# Patient Record
Sex: Male | Born: 1950 | Race: White | Hispanic: No | Marital: Married | Smoking: Never smoker
Health system: Southern US, Community
[De-identification: ages and names within clinical notes are randomized; demographics above are authoritative.]

## PROBLEM LIST (undated history)

## (undated) DIAGNOSIS — H409 Unspecified glaucoma: Secondary | ICD-10-CM

## (undated) DIAGNOSIS — T8859XA Other complications of anesthesia, initial encounter: Secondary | ICD-10-CM

## (undated) DIAGNOSIS — F329 Major depressive disorder, single episode, unspecified: Secondary | ICD-10-CM

## (undated) DIAGNOSIS — F32A Depression, unspecified: Secondary | ICD-10-CM

## (undated) DIAGNOSIS — E785 Hyperlipidemia, unspecified: Secondary | ICD-10-CM

## (undated) DIAGNOSIS — IMO0002 Reserved for concepts with insufficient information to code with codable children: Secondary | ICD-10-CM

## (undated) DIAGNOSIS — M8580 Other specified disorders of bone density and structure, unspecified site: Secondary | ICD-10-CM

## (undated) DIAGNOSIS — K219 Gastro-esophageal reflux disease without esophagitis: Secondary | ICD-10-CM

## (undated) DIAGNOSIS — K8020 Calculus of gallbladder without cholecystitis without obstruction: Secondary | ICD-10-CM

## (undated) DIAGNOSIS — N390 Urinary tract infection, site not specified: Secondary | ICD-10-CM

## (undated) DIAGNOSIS — R51 Headache: Secondary | ICD-10-CM

## (undated) DIAGNOSIS — A499 Bacterial infection, unspecified: Secondary | ICD-10-CM

## (undated) DIAGNOSIS — T4145XA Adverse effect of unspecified anesthetic, initial encounter: Secondary | ICD-10-CM

## (undated) DIAGNOSIS — I739 Peripheral vascular disease, unspecified: Secondary | ICD-10-CM

## (undated) DIAGNOSIS — I10 Essential (primary) hypertension: Secondary | ICD-10-CM

## (undated) DIAGNOSIS — K900 Celiac disease: Secondary | ICD-10-CM

## (undated) HISTORY — DX: Essential (primary) hypertension: I10

## (undated) HISTORY — DX: Hyperlipidemia, unspecified: E78.5

## (undated) HISTORY — PX: CATARACT EXTRACTION, BILATERAL: SHX1313

## (undated) HISTORY — PX: TONSILLECTOMY: SUR1361

## (undated) HISTORY — DX: Gastro-esophageal reflux disease without esophagitis: K21.9

## (undated) HISTORY — PX: TRANSURETHRAL RESECTION OF PROSTATE: SHX73

## (undated) HISTORY — PX: OTHER SURGICAL HISTORY: SHX169

---

## 2002-12-31 ENCOUNTER — Encounter: Admission: RE | Admit: 2002-12-31 | Discharge: 2002-12-31 | Payer: Self-pay | Admitting: Infectious Diseases

## 2003-01-07 ENCOUNTER — Encounter: Admission: RE | Admit: 2003-01-07 | Discharge: 2003-01-07 | Payer: Self-pay | Admitting: Infectious Diseases

## 2013-03-29 ENCOUNTER — Inpatient Hospital Stay (HOSPITAL_COMMUNITY)
Admission: EM | Admit: 2013-03-29 | Discharge: 2013-03-31 | DRG: 864 | Disposition: A | Discharge status: Home / Self Care | Payer: 59 | Attending: Internal Medicine | Admitting: Internal Medicine

## 2013-03-29 ENCOUNTER — Encounter (HOSPITAL_COMMUNITY): Payer: Self-pay | Admitting: Emergency Medicine

## 2013-03-29 DIAGNOSIS — D696 Thrombocytopenia, unspecified: Secondary | ICD-10-CM | POA: Diagnosis present

## 2013-03-29 DIAGNOSIS — R509 Fever, unspecified: Principal | ICD-10-CM | POA: Diagnosis present

## 2013-03-29 DIAGNOSIS — R7402 Elevation of levels of lactic acid dehydrogenase (LDH): Secondary | ICD-10-CM | POA: Diagnosis present

## 2013-03-29 DIAGNOSIS — D72819 Decreased white blood cell count, unspecified: Secondary | ICD-10-CM | POA: Diagnosis present

## 2013-03-29 DIAGNOSIS — R5381 Other malaise: Secondary | ICD-10-CM

## 2013-03-29 DIAGNOSIS — Z79899 Other long term (current) drug therapy: Secondary | ICD-10-CM

## 2013-03-29 DIAGNOSIS — W57XXXA Bitten or stung by nonvenomous insect and other nonvenomous arthropods, initial encounter: Secondary | ICD-10-CM | POA: Diagnosis present

## 2013-03-29 DIAGNOSIS — R531 Weakness: Secondary | ICD-10-CM

## 2013-03-29 DIAGNOSIS — A90 Dengue fever [classical dengue]: Secondary | ICD-10-CM | POA: Diagnosis present

## 2013-03-29 DIAGNOSIS — R7401 Elevation of levels of liver transaminase levels: Secondary | ICD-10-CM | POA: Diagnosis present

## 2013-03-29 DIAGNOSIS — B54 Unspecified malaria: Secondary | ICD-10-CM | POA: Diagnosis present

## 2013-03-29 DIAGNOSIS — H409 Unspecified glaucoma: Secondary | ICD-10-CM | POA: Diagnosis present

## 2013-03-29 DIAGNOSIS — A938 Other specified arthropod-borne viral fevers: Secondary | ICD-10-CM | POA: Diagnosis present

## 2013-03-29 DIAGNOSIS — Z7982 Long term (current) use of aspirin: Secondary | ICD-10-CM

## 2013-03-29 DIAGNOSIS — T148 Other injury of unspecified body region: Secondary | ICD-10-CM

## 2013-03-29 HISTORY — DX: Bacterial infection, unspecified: N39.0

## 2013-03-29 HISTORY — DX: Bacterial infection, unspecified: A49.9

## 2013-03-29 HISTORY — DX: Celiac disease: K90.0

## 2013-03-29 HISTORY — DX: Calculus of gallbladder without cholecystitis without obstruction: K80.20

## 2013-03-29 LAB — COMPREHENSIVE METABOLIC PANEL
ALT: 109 U/L — ABNORMAL HIGH (ref 0–53)
AST: 133 U/L — ABNORMAL HIGH (ref 0–37)
Albumin: 3.3 g/dL — ABNORMAL LOW (ref 3.5–5.2)
Alkaline Phosphatase: 148 U/L — ABNORMAL HIGH (ref 39–117)
BUN: 10 mg/dL (ref 6–23)
CO2: 30 mEq/L (ref 19–32)
Calcium: 8.7 mg/dL (ref 8.4–10.5)
Chloride: 99 mEq/L (ref 96–112)
Creatinine, Ser: 0.97 mg/dL (ref 0.50–1.35)
GFR calc Af Amer: >90 mL/min (ref >90)
GFR calc non Af Amer: 87 mL/min — ABNORMAL LOW (ref >90)
Glucose, Bld: 111 mg/dL — ABNORMAL HIGH (ref 70–99)
Potassium: 4.8 mEq/L (ref 3.5–5.1)
Sodium: 132 mEq/L — ABNORMAL LOW (ref 135–145)
Total Bilirubin: 0.5 mg/dL (ref 0.3–1.2)
Total Protein: 5.6 g/dL — ABNORMAL LOW (ref 6.0–8.3)

## 2013-03-29 LAB — CBC WITH DIFFERENTIAL/PLATELET
Basophils Absolute: 0.1 10*3/uL (ref 0.0–0.1)
Basophils Relative: 4 % — ABNORMAL HIGH (ref 0–1)
Eosinophils Absolute: 0 10*3/uL (ref 0.0–0.7)
Eosinophils Relative: 0 % (ref 0–5)
HCT: 40.9 % (ref 39.0–52.0)
Hemoglobin: 13.5 g/dL (ref 13.0–17.0)
Lymphocytes Relative: 40 % (ref 12–46)
Lymphs Abs: 0.7 10*3/uL (ref 0.7–4.0)
MCH: 28.9 pg (ref 26.0–34.0)
MCHC: 33 g/dL (ref 30.0–36.0)
MCV: 87.6 fL (ref 78.0–100.0)
Monocytes Absolute: 0.1 10*3/uL (ref 0.1–1.0)
Monocytes Relative: 8 % (ref 3–12)
Neutro Abs: 0.9 10*3/uL — ABNORMAL LOW (ref 1.7–7.7)
Neutrophils Relative %: 48 % (ref 43–77)
Platelets: 73 10*3/uL — ABNORMAL LOW (ref 150–400)
RBC: 4.67 MIL/uL (ref 4.22–5.81)
RDW: 14.5 % (ref 11.5–15.5)
WBC Morphology: INCREASED
WBC: 1.8 10*3/uL — ABNORMAL LOW (ref 4.0–10.5)

## 2013-03-29 LAB — URINALYSIS, ROUTINE W REFLEX MICROSCOPIC
Bilirubin Urine: NEGATIVE
Glucose, UA: NEGATIVE mg/dL
Hgb urine dipstick: NEGATIVE
Ketones, ur: NEGATIVE mg/dL
Leukocytes, UA: NEGATIVE
Nitrite: NEGATIVE
Protein, ur: NEGATIVE mg/dL
Specific Gravity, Urine: 1.013 (ref 1.005–1.030)
Urobilinogen, UA: 0.2 mg/dL (ref 0.0–1.0)
pH: 7 (ref 5.0–8.0)

## 2013-03-29 MED ORDER — HYDROMORPHONE HCL PF 1 MG/ML IJ SOLN: 0.5000 mg | INTRAMUSCULAR | Status: DC | PRN

## 2013-03-29 MED ORDER — ZOLPIDEM TARTRATE 5 MG PO TABS: 5.0000 mg | ORAL_TABLET | Freq: Every evening | ORAL | Status: DC | PRN

## 2013-03-29 MED ORDER — ATOVAQUONE-PROGUANIL HCL 250-100 MG PO TABS
1.0000 | ORAL_TABLET | Freq: Every day | ORAL | Status: DC
  Administered 2013-03-30: 1 via ORAL
  Filled 2013-03-29: qty 1

## 2013-03-29 MED ORDER — AMITRIPTYLINE HCL 50 MG PO TABS: 50.0000 mg | ORAL_TABLET | Freq: Every day | ORAL | Status: DC

## 2013-03-29 MED ORDER — ACETAMINOPHEN 325 MG PO TABS
650.0000 mg | ORAL_TABLET | Freq: Four times a day (QID) | ORAL | Status: DC | PRN
  Administered 2013-03-30 – 2013-03-31 (×2): 650 mg via ORAL
  Filled 2013-03-29 (×3): qty 2

## 2013-03-29 MED ORDER — ONDANSETRON HCL 4 MG/2ML IJ SOLN: 4.0000 mg | Freq: Four times a day (QID) | INTRAMUSCULAR | Status: DC | PRN

## 2013-03-29 MED ORDER — ONDANSETRON HCL 4 MG PO TABS: 4.0000 mg | ORAL_TABLET | Freq: Four times a day (QID) | ORAL | Status: DC | PRN

## 2013-03-29 MED ORDER — ATORVASTATIN CALCIUM 10 MG PO TABS
10.0000 mg | ORAL_TABLET | Freq: Every day | ORAL | Status: DC
  Administered 2013-03-30: 10 mg via ORAL
  Filled 2013-03-29 (×2): qty 1

## 2013-03-29 MED ORDER — SODIUM CHLORIDE 0.9 % IV SOLN
INTRAVENOUS | Status: DC
  Administered 2013-03-29 – 2013-03-31 (×3): via INTRAVENOUS

## 2013-03-29 MED ORDER — TIMOLOL MALEATE 0.5 % OP SOLN
1.0000 [drp] | Freq: Two times a day (BID) | OPHTHALMIC | Status: DC
  Administered 2013-03-29 – 2013-03-31 (×4): 1 [drp] via OPHTHALMIC
  Filled 2013-03-29: qty 5

## 2013-03-29 MED ORDER — DOXYCYCLINE HYCLATE 100 MG IV SOLR
100.0000 mg | Freq: Two times a day (BID) | INTRAVENOUS | Status: DC
  Administered 2013-03-30 – 2013-03-31 (×3): 100 mg via INTRAVENOUS
  Filled 2013-03-29 (×4): qty 100

## 2013-03-29 MED ORDER — ALUM & MAG HYDROXIDE-SIMETH 200-200-20 MG/5ML PO SUSP
30.0000 mL | Freq: Four times a day (QID) | ORAL | Status: DC | PRN
  Filled 2013-03-29: qty 30

## 2013-03-29 MED ORDER — CALCIUM CARBONATE ANTACID 500 MG PO CHEW
2.0000 | CHEWABLE_TABLET | Freq: Every day | ORAL | Status: DC
  Administered 2013-03-30 – 2013-03-31 (×2): 400 mg via ORAL
  Filled 2013-03-29 (×2): qty 2

## 2013-03-29 MED ORDER — OXYCODONE HCL 5 MG PO TABS: 5.0000 mg | ORAL_TABLET | ORAL | Status: DC | PRN

## 2013-03-29 MED ORDER — VITAMIN D3 25 MCG (1000 UNIT) PO TABS
5000.0000 [IU] | ORAL_TABLET | ORAL | Status: DC
  Administered 2013-03-31: 5000 [IU] via ORAL
  Filled 2013-03-29: qty 5

## 2013-03-29 MED ORDER — DOXYCYCLINE HYCLATE 100 MG PO TABS
100.0000 mg | ORAL_TABLET | Freq: Once | ORAL | Status: AC
  Administered 2013-03-29: 100 mg via ORAL
  Filled 2013-03-29: qty 1

## 2013-03-29 MED ORDER — ACETAMINOPHEN 650 MG RE SUPP: 650.0000 mg | Freq: Four times a day (QID) | RECTAL | Status: DC | PRN

## 2013-03-29 NOTE — ED Notes (Signed)
Patient presents to ED with c/o fever, chills, body aches, weakness headache. Patient was living in [place] and returned home 2 weeks ago and was exposed to malaria. PCP started malaria treatment and [facility] spotted fever treatment. Wife is PA and states  Blood was drawn for malaria and cbc shows wbc low.

## 2013-03-29 NOTE — ED Notes (Addendum)
Dr. Lovell Sheehan states pt needs general precaution protect pt due to low WBC and no need for negative pressure room. Receiving RN notified in the unit.

## 2013-03-29 NOTE — ED Provider Notes (Signed)
History     CSN: [id number]  Arrival date & time 03/29/13  7829   First MD Initiated Contact with Patient 03/29/13 1903      Chief Complaint  Patient presents with  . Fever   HPI  History provided by the patient and wife. Patient is a 62 year old male with history of prostatectomy and recurrent UTIs, celiac disease who presents with fever and leukopenia. 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 They returned on the flight to [place] 2 weeks ago. Patient did have a high kink in camping trip where he did have a tick bite him in the [place] area. The family has also driven and travel to [place] and then since return to the [place] area. The patient then began having fevers last week. These would recur over several hours. He was taking antipyretics for his symptoms. His wife who is a PA had rapid testing for malaria which she used twice but these were negative. Patient was seen at the PCP walk-in clinic yesterday and was started on Malarone for possible malaria as well as doxycycline for possible [facility] spotted fever or other tickborne illness. Patient return for a recheck of his lab work yesterday with further drop of his white blood cell count. He has continued to feel fatigue and poor. He denies having any of his typical urinary symptoms from previous UTIs. He has not had any cough or shortness of breath. Denies any rash of the skin.      Past Medical History  Diagnosis Date  . Bacterial UTI   . Gallstones   . Celiac disease     History reviewed. No pertinent past surgical history.  History reviewed. No pertinent family history.  History  Substance Use Topics  . Smoking status: Not on file  . Smokeless tobacco: Not on file  . Alcohol Use: Not on file      Review of Systems  Constitutional: Positive for fever and fatigue.  Respiratory: Negative for cough and shortness of breath.   Gastrointestinal: Negative for nausea,  vomiting and diarrhea.  Skin: Negative for rash.  All other systems reviewed and are negative.    Allergies  Gluten meal  Home Medications   Current Outpatient Rx  Name  Route  Sig  Dispense  Refill  . Acetaminophen 500 MG coapsule   Oral   Take 1,000 mg by mouth every 6 (six) hours as needed for fever.         Marland Kitchen amitriptyline (ELAVIL) 50 MG tablet   Oral   Take 50 mg by mouth at bedtime.         Marland Kitchen aspirin EC 81 MG tablet   Oral   Take 81 mg by mouth daily.         Marland Kitchen atovaquone-proguanil (MALARONE) 250-100 MG TABS   Oral   Take 4 tablets by mouth daily.         . Calcium Carbonate Antacid (TUMS ULTRA 1000 PO)   Oral   Take 1 tablet by mouth daily.         . Cholecalciferol 5000 UNITS TABS   Oral   Take 5,000 Units by mouth 3 (three) times a week.         . doxycycline (VIBRA-TABS) 100 MG tablet   Oral   Take 100 mg by mouth 2 (two) times daily.         Marland Kitchen ibuprofen (ADVIL,MOTRIN) 800 MG tablet   Oral  Take 800 mg by mouth daily as needed (migraine).         . rosuvastatin (CRESTOR) 5 MG tablet   Oral   Take 5 mg by mouth every other day.         . SUMAtriptan (IMITREX) 100 MG tablet   Oral   Take 50 mg by mouth every 2 (two) hours as needed for migraine.         . timolol (BETIMOL) 0.5 % ophthalmic solution      1 drop 2 (two) times daily.           BP 110/64  Pulse 58  Temp(Src) 98.1 F (36.7 C) (Oral)  Resp 20  SpO2 99%  Physical Exam  Nursing note and vitals reviewed. Constitutional: He is oriented to person, place, and time. He appears well-developed and well-nourished. No distress.  HENT:  Head: Normocephalic.  Neck: Normal range of motion. Neck supple.  No meningeal signs  Cardiovascular: Normal rate and regular rhythm.   Pulmonary/Chest: Effort normal and breath sounds normal. No respiratory distress. He has no wheezes. He has no rales.  Abdominal: Soft. There is no tenderness.  Musculoskeletal: Normal range of  motion.  Neurological: He is alert and oriented to person, place, and time.  Skin: Skin is warm. No rash noted.  Psychiatric: He has a normal mood and affect. His behavior is normal.    ED Course  Procedures   Results for orders placed during the hospital encounter of 03/29/13  CBC WITH DIFFERENTIAL      Result Value Range   WBC 1.8 (*) 4.0 - 10.5 K/uL   RBC 4.67  4.22 - 5.81 MIL/uL   Hemoglobin 13.5  13.0 - 17.0 g/dL   HCT 29.5  62.1 - 30.8 %   MCV 87.6  78.0 - 100.0 fL   MCH 28.9  26.0 - 34.0 pg   MCHC 33.0  30.0 - 36.0 g/dL   RDW 65.7  84.6 - 96.2 %   Platelets 73 (*) 150 - 400 K/uL   Neutrophils Relative % 48  43 - 77 %   Lymphocytes Relative 40  12 - 46 %   Monocytes Relative 8  3 - 12 %   Eosinophils Relative 0  0 - 5 %   Basophils Relative 4 (*) 0 - 1 %   Neutro Abs 0.9 (*) 1.7 - 7.7 K/uL   Lymphs Abs 0.7  0.7 - 4.0 K/uL   Monocytes Absolute 0.1  0.1 - 1.0 K/uL   Eosinophils Absolute 0.0  0.0 - 0.7 K/uL   Basophils Absolute 0.1  0.0 - 0.1 K/uL   RBC Morphology TEARDROP CELLS     WBC Morphology INCREASED BANDS (>20% BANDS)     Smear Review LARGE PLATELETS PRESENT    COMPREHENSIVE METABOLIC PANEL      Result Value Range   Sodium 132 (*) 135 - 145 mEq/L   Potassium 4.8  3.5 - 5.1 mEq/L   Chloride 99  96 - 112 mEq/L   CO2 30  19 - 32 mEq/L   Glucose, Bld 111 (*) 70 - 99 mg/dL   BUN 10  6 - 23 mg/dL   Creatinine, Ser 9.52  0.50 - 1.35 mg/dL   Calcium 8.7  8.4 - 84.1 mg/dL   Total Protein 5.6 (*) 6.0 - 8.3 g/dL   Albumin 3.3 (*) 3.5 - 5.2 g/dL   AST 324 (*) 0 - 37 U/L   ALT 109 (*) 0 - 53  U/L   Alkaline Phosphatase 148 (*) 39 - 117 U/L   Total Bilirubin 0.5  0.3 - 1.2 mg/dL   GFR calc non Af Amer 87 (*) >90 mL/min   GFR calc Af Amer >90  >90 mL/min  URINALYSIS, ROUTINE W REFLEX MICROSCOPIC      Result Value Range   Color, Urine YELLOW  YELLOW   APPearance CLEAR  CLEAR   Specific Gravity, Urine 1.013  1.005 - 1.030   pH 7.0  5.0 - 8.0   Glucose, UA NEGATIVE   NEGATIVE mg/dL   Hgb urine dipstick NEGATIVE  NEGATIVE   Bilirubin Urine NEGATIVE  NEGATIVE   Ketones, ur NEGATIVE  NEGATIVE mg/dL   Protein, ur NEGATIVE  NEGATIVE mg/dL   Urobilinogen, UA 0.2  0.0 - 1.0 mg/dL   Nitrite NEGATIVE  NEGATIVE   Leukocytes, UA NEGATIVE  NEGATIVE         1. Fever   2. Leukopenia       MDM  8:05 PM patient seen and evaluated. Patient currently resting comfortably no acute distress. Initially he is afebrile at triage. He has been taking antipyretics.  Today patient was seen by Dr. Zachery Dauer at the [facility] walk-in clinic. He did telephone the on-call infectious disease doctor here at [facility], Dr. Drue Second. I spoke with Dr. Drue Second directly. She has recommended several blood testing for malaria and tick borne illnesses. Also advised to have blood cultures drawn. She believes patient will benefit from observation admission.  She recommends consulting the hospitalist.she will continue to follow the blood smear and lab results for any additional recommendations. She believes he should continue his Malarone and doxycycline   Spoke with Dr. Lovell Sheehan with [facility].  She will see pt and admit. Would like holding orders for med-surg bed under team [id number].   Angus Seller, PA-C 03/29/13 2208

## 2013-03-29 NOTE — H&P (Addendum)
[facility] History and Physical  [name] ZOX:[id number] DOB: June 11, 1951 DOA: 03/29/2013  Referring physician: EDP PCP: Daisy Floro, MD  Specialists:    Chief Complaint: Fevers  HPI: Francisco Rodriguez is a 62 y.o. male who returned from [place] 2 weeks ago presenting to the ED with complaints of cyclic fevers  Occuring every 8 hours for the past 5 days.   He reports headaches, fevers, chills, anorexia, malaise, myalgias and rigors since Tuesday.   His wife who is a PA performed a rapid Malaria Test which was negative.  He had seen a tick on his body 2 weeks ago as well but he denies seeing a rash on his body .   He was taken to the [facility] and evaluated 1 day ago and was found to have leukopenia and thrombocytopenia and was placed on Malarone, and Doxycycline.   He returned to the clinic today and had further decrease in his WBCs and PLTs and was referred to the ED.  The EDP evaluated and contacted ID on Call Dr.Snyder who recommended continuing the Malarone and Doxycycline.   He was referred for medical admission and placed on Neutropenic Precautions.    Review of Systems: The patient denies weight loss, vision loss, decreased hearing, hoarseness, chest pain, syncope, dyspnea on exertion, peripheral edema, balance deficits, hemoptysis, abdominal pain, nausea, vomiting, diarrhea, constipation, hematemesis, melena, hematochezia, severe indigestion/heartburn, hematuria, incontinence, suspicious skin lesions, transient blindness, difficulty walking, depression, unusual weight change, abnormal bleeding, enlarged lymph nodes, angioedema, and breast masses.    Past Medical History  Diagnosis Date  . Bacterial UTI   . Gallstones   . Celiac disease         Hx DVT in RLE 20 years ago (Untreated)       Glaucoma   Past Surgical History  Procedure Laterality Date  . Cataract extraction, bilateral    . 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 Authorizing Provider  Acetaminophen 500 MG coapsule Take 1,000 mg by mouth every 6 (six) hours as needed for fever.   Yes Historical Provider, MD  amitriptyline (ELAVIL) 50 MG tablet Take 50 mg by mouth at bedtime.   Yes Historical Provider, MD  aspirin EC 81 MG tablet Take 81 mg by mouth daily.   Yes Historical Provider, MD  atovaquone-proguanil (MALARONE) 250-100 MG TABS Take 4 tablets by mouth daily.   Yes Historical Provider, MD  Calcium Carbonate Antacid (TUMS ULTRA 1000 PO) Take 1 tablet by mouth daily.   Yes Historical Provider, MD  Cholecalciferol 5000 UNITS TABS Take 5,000 Units by mouth 3 (three) times a week.   Yes Historical Provider, MD  doxycycline (VIBRA-TABS) 100 MG tablet Take 100 mg by mouth 2 (two) times daily.   Yes Historical Provider, MD  ibuprofen (ADVIL,MOTRIN) 800 MG tablet Take 800 mg by mouth daily as needed (migraine).   Yes Historical Provider, MD  rosuvastatin (CRESTOR) 5 MG tablet Take 5 mg by mouth every other day.   Yes Historical Provider, MD  SUMAtriptan (IMITREX) 100 MG tablet Take 50 mg by mouth every 2 (two) hours as needed for migraine.   Yes Historical Provider, MD  timolol (BETIMOL) 0.5 % ophthalmic solution 1 drop 2 (two) times daily.   Yes Historical Provider, MD    Allergies:  Allergies  Allergen  Reactions  . 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    Family History: Family History  Problem Relation Age of Onset  . Hypertension Father      Physical Exam:  GEN:   Pleasant  Well Nourished and well developed Obese 62 year old Caucasian Male  examined  and in no acute distress; cooperative with exam Filed Vitals:   03/29/13 2100 03/29/13 2130 03/29/13 2144 03/29/13 2258  BP: 108/69 107/57  116/67  Pulse: 57 54   58  Temp:    98.5 F (36.9 C)  TempSrc:    Oral  Resp:    18  Height:    5\' 11"  (1.803 m)  Weight:    89.6 kg (197 lb 8.5 oz)  SpO2: 98% 98% 98% 98%   Blood pressure 116/67, pulse 58, temperature 98.5 F (36.9 C), temperature source Oral, resp. rate 18, height 5\' 11"  (1.803 m), weight 89.6 kg (197 lb 8.5 oz), SpO2 98.00%. PSYCH: He is alert and oriented x4; does not appear anxious does not appear depressed; affect is normal HEENT: Normocephalic and Atraumatic, Mucous membranes pink; PERRLA; EOM intact; Fundi:  Benign;  No scleral icterus, Nares: Patent, Oropharynx: Clear, Fair Dentition, Neck:  FROM, no cervical lymphadenopathy nor thyromegaly or carotid bruit; no JVD; Breasts:: Not examined CHEST WALL: No tenderness CHEST: Normal respiration, clear to auscultation bilaterally HEART: Regular rate and rhythm; no murmurs rubs or gallops BACK: No kyphosis or scoliosis; no CVA tenderness ABDOMEN: Positive Bowel Sounds, Obese, soft non-tender; no masses, no organomegaly.    Rectal Exam: Not done EXTREMITIES: No cyanosis, clubbing or edema; no ulcerations. Genitalia: not examined PULSES: 2+ and symmetric SKIN: Normal hydration no rash or ulceration CNS: Cranial nerves 2-12 grossly intact no focal neurologic deficit   Labs & Imaging Results for orders placed during the hospital encounter of 03/29/13 (from the past 48 hour(s))  CBC WITH DIFFERENTIAL     Status: Abnormal   Collection Time    03/29/13  8:25 PM      Result Value Range   WBC 1.8 (*) 4.0 - 10.5 K/uL   RBC 4.67  4.22 - 5.81 MIL/uL   Hemoglobin 13.5  13.0 - 17.0 g/dL   HCT 47.8  29.5 - 62.1 %   MCV 87.6  78.0 - 100.0 fL   MCH 28.9  26.0 - 34.0 pg   MCHC 33.0  30.0 - 36.0 g/dL   RDW 30.8  65.7 - 84.6 %   Platelets 73 (*) 150 - 400 K/uL   Comment: PLATELET COUNT CONFIRMED BY SMEAR     LARGE PLATELETS PRESENT   Neutrophils Relative % 48  43 - 77 %   Lymphocytes Relative 40  12 - 46 %   Monocytes Relative 8  3 - 12 %    Eosinophils Relative 0  0 - 5 %   Basophils Relative 4 (*) 0 - 1 %   Neutro Abs 0.9 (*) 1.7 - 7.7 K/uL   Lymphs Abs 0.7  0.7 - 4.0 K/uL   Monocytes Absolute 0.1  0.1 - 1.0 K/uL   Eosinophils Absolute 0.0  0.0 - 0.7 K/uL   Basophils Absolute 0.1  0.0 - 0.1 K/uL   RBC Morphology TEARDROP CELLS     WBC Morphology INCREASED BANDS (>20% BANDS)     Smear Review LARGE PLATELETS PRESENT  COMPREHENSIVE METABOLIC PANEL     Status: Abnormal   Collection Time    03/29/13  9:04 PM      Result Value Range   Sodium 132 (*) 135 - 145 mEq/L   Potassium 4.8  3.5 - 5.1 mEq/L   Chloride 99  96 - 112 mEq/L   CO2 30  19 - 32 mEq/L   Glucose, Bld 111 (*) 70 - 99 mg/dL   BUN 10  6 - 23 mg/dL   Creatinine, Ser 1.61  0.50 - 1.35 mg/dL   Calcium 8.7  8.4 - 09.6 mg/dL   Total Protein 5.6 (*) 6.0 - 8.3 g/dL   Albumin 3.3 (*) 3.5 - 5.2 g/dL   AST 045 (*) 0 - 37 U/L   ALT 109 (*) 0 - 53 U/L   Alkaline Phosphatase 148 (*) 39 - 117 U/L   Total Bilirubin 0.5  0.3 - 1.2 mg/dL   GFR calc non Af Amer 87 (*) >90 mL/min   GFR calc Af Amer >90  >90 mL/min   Comment:            The eGFR has been calculated     using the CKD EPI equation.     This calculation has not been     validated in all clinical     situations.     eGFR's persistently     <90 mL/min signify     possible Chronic Kidney Disease.  URINALYSIS, ROUTINE W REFLEX MICROSCOPIC     Status: None   Collection Time    03/29/13  9:20 PM      Result Value Range   Color, Urine YELLOW  YELLOW   APPearance CLEAR  CLEAR   Specific Gravity, Urine 1.013  1.005 - 1.030   pH 7.0  5.0 - 8.0   Glucose, UA NEGATIVE  NEGATIVE mg/dL   Hgb urine dipstick NEGATIVE  NEGATIVE   Bilirubin Urine NEGATIVE  NEGATIVE   Ketones, ur NEGATIVE  NEGATIVE mg/dL   Protein, ur NEGATIVE  NEGATIVE mg/dL   Urobilinogen, UA 0.2  0.0 - 1.0 mg/dL   Nitrite NEGATIVE  NEGATIVE   Leukocytes, UA NEGATIVE  NEGATIVE   Comment: MICROSCOPIC NOT DONE ON URINES WITH NEGATIVE PROTEIN,  BLOOD, LEUKOCYTES, NITRITE, OR GLUCOSE <1000 mg/dL.     Assessment/Plan Principal Problem:   Fever Active Problems:   Leukopenia   Thrombocytopenia   Weakness   Tick bite   1.   Fever-  Differential diagnosis includes:  Malaria and possibly Tick Borne Diseases such as RSMF.  Both Covered by IV Doxycycline, and per advice of ID Continue Malarone for the Malarial Diseases as well.  ID (Dr. Ilsa Iha) to see in AM.   Note that patient's wife reports performing a rapid test for Plasmodium Falciparum which returned negative, however not clear if this test also applies to the other 3 strains of malaria ( P.vivax, ovale, or malariae).     2.   Leukopenia and Thrombocytopenia-  Due to Illness #1,  Continue Antibiotics.  Monitor Trends, Neutropenic Precautions.     3.   Weakness- Due to Illness.    4.   Tick Bite Hx-  Check Titers for RMSF, and Lyme Disease.     5.  SCDs for DVT prophylaxis due to Thrombocytopenia   Code Status: FULL CODE    Family Communication:  Wife at Bedside    Disposition Plan:  Return to Home      Time spent: 27 Minutes  Ron Parker [facility] Pager [phone]  If 7PM-7AM, please contact night-coverage [email or web address] Password [facility] 03/29/2013, 11:20 PM

## 2013-03-30 DIAGNOSIS — D709 Neutropenia, unspecified: Secondary | ICD-10-CM

## 2013-03-30 DIAGNOSIS — D696 Thrombocytopenia, unspecified: Secondary | ICD-10-CM

## 2013-03-30 DIAGNOSIS — R5081 Fever presenting with conditions classified elsewhere: Secondary | ICD-10-CM

## 2013-03-30 LAB — PROTIME-INR
INR: 0.98 (ref 0.00–1.49)
Prothrombin Time: 12.9 seconds (ref 11.6–15.2)

## 2013-03-30 LAB — BASIC METABOLIC PANEL
BUN: 9 mg/dL (ref 6–23)
CO2: 30 mEq/L (ref 19–32)
Calcium: 8.1 mg/dL — ABNORMAL LOW (ref 8.4–10.5)
Chloride: 104 mEq/L (ref 96–112)
Creatinine, Ser: 0.93 mg/dL (ref 0.50–1.35)
GFR calc Af Amer: >90 mL/min (ref >90)
GFR calc non Af Amer: 88 mL/min — ABNORMAL LOW (ref >90)
Glucose, Bld: 98 mg/dL (ref 70–99)
Potassium: 4.7 mEq/L (ref 3.5–5.1)
Sodium: 137 mEq/L (ref 135–145)

## 2013-03-30 LAB — DIFFERENTIAL
Basophils Relative: 4 % — ABNORMAL HIGH (ref 0–1)
Eosinophils Relative: 0 % (ref 0–5)
Lymphocytes Relative: 41 % (ref 12–46)
Monocytes Relative: 19 % — ABNORMAL HIGH (ref 3–12)
Neutrophils Relative %: 36 % — ABNORMAL LOW (ref 43–77)
WBC Morphology: INCREASED

## 2013-03-30 LAB — CBC
HCT: 39.9 % (ref 39.0–52.0)
Hemoglobin: 13.2 g/dL (ref 13.0–17.0)
MCH: 29 pg (ref 26.0–34.0)
MCHC: 33.1 g/dL (ref 30.0–36.0)
MCV: 87.7 fL (ref 78.0–100.0)
Platelets: 68 10*3/uL — ABNORMAL LOW (ref 150–400)
RBC: 4.55 MIL/uL (ref 4.22–5.81)
RDW: 14.5 % (ref 11.5–15.5)
WBC: 1.7 10*3/uL — ABNORMAL LOW (ref 4.0–10.5)

## 2013-03-30 LAB — HEPATIC FUNCTION PANEL
ALT: 98 U/L — ABNORMAL HIGH (ref 0–53)
AST: 95 U/L — ABNORMAL HIGH (ref 0–37)
Albumin: 2.9 g/dL — ABNORMAL LOW (ref 3.5–5.2)
Alkaline Phosphatase: 138 U/L — ABNORMAL HIGH (ref 39–117)
Bilirubin, Direct: 0.2 mg/dL (ref 0.0–0.3)
Indirect Bilirubin: 0.2 mg/dL — ABNORMAL LOW (ref 0.3–0.9)
Total Bilirubin: 0.4 mg/dL (ref 0.3–1.2)
Total Protein: 5.4 g/dL — ABNORMAL LOW (ref 6.0–8.3)

## 2013-03-30 LAB — FIBRINOGEN: Fibrinogen: 259 mg/dL (ref 204–475)

## 2013-03-30 MED ORDER — ATOVAQUONE-PROGUANIL HCL 250-100 MG PO TABS
4.0000 | ORAL_TABLET | Freq: Every day | ORAL | Status: DC
  Administered 2013-03-30: 3 via ORAL
  Filled 2013-03-30: qty 4

## 2013-03-30 MED ORDER — ATOVAQUONE-PROGUANIL HCL 250-100 MG PO TABS
4.0000 | ORAL_TABLET | Freq: Every day | ORAL | Status: DC
  Filled 2013-03-30: qty 4

## 2013-03-30 NOTE — Progress Notes (Signed)
[facility] PROGRESS NOTE  Francisco Rodriguez:[id number] DOB: 04-10-1951 DOA: 03/29/2013 PCP: Daisy Floro, MD  HPI/Subjective: Fever is improving, no fever since last night still have some sweating.  Assessment/Plan:  Febrile illness -Patient reports cyclical fever, he was recently back from [place]. -Patient started on Malarone and doxycycline, his fever is improved -Presumed malaria, cannot rule out other viral infections including Dengue fever. -Waiting for peripheral blood smear, ID to evaluate  Leukopenia/thrombocytopenia -This is likely secondary to febrile illness which is likely malaria. -Patient is improving with Malarone and doxycycline. -Watches platelets and WBC closely as they're trending down now. -Leukopenia precautions, avoid fresh fruits and they to tell absolute neutrophil count greater than 1000.  Transaminitis -Without jaundice, no is hemolysis, his hemoglobin is normal. -Also likely secondary to his febrile illness malaria versus other viral infections including Dengue fever. -Malarial infection usually has some degree of hemolysis secondary to intravascular hemolysis/blood sequestration.    Code Status: Full code Family Communication: Plan discussed with the patient and his wife at bedside. Disposition Plan: Remains inpatient   Consultants:  Drue Second of the infectious disease  Procedures:  None  Antibiotics:  Antimalarial, patient is on Malarone and doxycycline  Objective: Filed Vitals:   03/29/13 2258 03/30/13 0001 03/30/13 0402 03/30/13 0835  BP: 116/67 114/54 124/86 131/63  Pulse: 58 58 57 67  Temp: 98.5 F (36.9 C) 98.3 F (36.8 C) 97.6 F (36.4 C) 98.1 F (36.7 C)  TempSrc: Oral Oral Oral Oral  Resp: 18 18 16 18   Height: 5\' 11"  (1.803 m)     Weight: 89.6 kg (197 lb 8.5 oz)     SpO2: 98% 99% 100% 97%    Intake/Output Summary (Last 24 hours) at 03/30/13 1101 Last data filed at 03/30/13 1040  Gross per 24 hour  Intake   837.5 ml  Output   1775 ml  Net -937.5 ml   Filed Weights   03/29/13 2258  Weight: 89.6 kg (197 lb 8.5 oz)    Exam:  General: Alert and awake, oriented x3, not in any acute distress. HEENT: anicteric sclera, pupils reactive to light and accommodation, EOMI CVS: S1-S2 clear, no murmur rubs or gallops Chest: clear to auscultation bilaterally, no wheezing, rales or rhonchi Abdomen: soft nontender, nondistended, normal bowel sounds, no organomegaly Extremities: no cyanosis, clubbing or edema noted bilaterally Neuro: Cranial nerves II-XII intact, no focal neurological deficits  Data Reviewed: Basic Metabolic Panel:  Recent Labs Lab 03/29/13 2104 03/30/13 0520  NA 132* 137  K 4.8 4.7  CL 99 104  CO2 30 30  GLUCOSE 111* 98  BUN 10 9  CREATININE 0.97 0.93  CALCIUM 8.7 8.1*   Liver Function Tests:  Recent Labs Lab 03/29/13 2104  AST 133*  ALT 109*  ALKPHOS 148*  BILITOT 0.5  PROT 5.6*  ALBUMIN 3.3*   No results found for this basename: LIPASE, AMYLASE,  in the last 168 hours No results found for this basename: AMMONIA,  in the last 168 hours CBC:  Recent Labs Lab 03/29/13 2025 03/30/13 0520  WBC 1.8* 1.7*  NEUTROABS 0.9*  --   HGB 13.5 13.2  HCT 40.9 39.9  MCV 87.6 87.7  PLT 73* 68*   Cardiac Enzymes: No results found for this basename: CKTOTAL, CKMB, CKMBINDEX, TROPONINI,  in the last 168 hours BNP (last 3 results) No results found for this basename: PROBNP,  in the last 8760 hours CBG: No results found for this basename: GLUCAP,  in the last 168  hours  No results found for this or any previous visit (from the past 240 hour(s)).   Studies: No results found.  Scheduled Meds: . atorvastatin  10 mg Oral q1800  . atovaquone-proguanil  4 tablet Oral Daily  . calcium carbonate  2 tablet Oral Daily  . [START ON 03/31/2013] cholecalciferol  5,000 Units Oral 3 times weekly  . doxycycline (VIBRAMYCIN) IV  100 mg Intravenous Q12H  . timolol  1 drop Both Eyes  BID   Continuous Infusions: . sodium chloride 125 mL/hr at 03/30/13 0657    Principal Problem:   Fever Active Problems:   Leukopenia   Thrombocytopenia   Weakness   Tick bite    Time spent: 35 minutes    [facility]  [facility] Pager [phone]. If 7PM-7AM, please contact night-coverage at [email or web address], password [facility] 03/30/2013, 11:01 AM  LOS: 1 day

## 2013-03-30 NOTE — ED Provider Notes (Signed)
Medical screening examination/treatment/procedure(s) were performed by non-physician practitioner and as supervising physician I was immediately available for consultation/collaboration.    Fynn Vanblarcom L Takari Duncombe, MD 03/30/13 1525 

## 2013-03-30 NOTE — Consult Note (Addendum)
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 After a hike, he and his wife noticed small ticks on their bodies, not firmly attached to skin. No Rash. However, noticed the development of unilateral HA, associated with photophobia and noise sensitivity on Tuesday which persisted to the following day, which is unusual for his migraine history. He noticed developing rigors and Tmax of 103, which cycled every 8-12 hrs. His wife is a PA did a rapid malaria test kit for p.falicaparum was negative x 2 on thur and Friday. Through out Wed-Fri, he had rigors, fever, chills, sweats, broken with tylenol, malaise and myalgia. He saw Dr. Tenny Craw at [facility] walk-in clinic who checked labs which noted leukopenia, thrombocytopenia. Malaria smears sent to [facility]. He was started on malarone tx doses, and doxycycline on evening of 6/6. He presented to walk-in clinic on 6/7 feeling worse, labs slightly worsened as well, thus sent to ED for evaluation/ 24hr obs. Doxycycline and malarone continued. Peripheral thin smear did not show malaria as prelim report.  In [place], he and his wife do not take malarial proph. Last had malaria 61yrs ago, although they do test when ill. They use bednets and mosquito spray. 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 Last took nitrofurantoin in April 2014.   No  recent diarrheal disease. They do recall going [place]. 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 Urine tested for shisto, but never had serology testing. No recent outbreaks that they know of in [place]. No travel to neighboring countries. He is uptodate on typhoid oral vaccine (2010).  HA is unilateral, denies retroorbital pain, throbbing. Has myalgias but no significant joint pain, no back pain, no quadricept pain, denies n/v/diarrhea, no rash.   Sicne being admitted, he feels better. No fevers, but just had episode of chills.    Past Medical History  Diagnosis Date  . Bacterial UTI   . Gallstones   . Celiac disease   - glaucoma s/p cataract surgery - s/p tonsillectomy - BPH s/p turp - h./o right leg DVT  Allergies:  Allergies  Allergen Reactions  . Gluten Meal      MEDICATIONS: . atorvastatin  10 mg Oral q1800  . atovaquone-proguanil  4 tablet Oral Daily  . calcium carbonate  2 tablet Oral Daily  . [START ON 03/31/2013] cholecalciferol  5,000 Units Oral 3 times weekly  . doxycycline (VIBRAMYCIN) IV  100 mg Intravenous Q12H  . timolol  1 drop Both Eyes BID    History  Substance Use Topics  .  Smoking status: Never Smoker   . Smokeless tobacco: Never Used  . Alcohol Use: No  - he and his wife live in [place], [place]. Last came the [name] last year for their daughter's wedding; here for 3 months for their furlough Q 35yrs.  Family History  Problem Relation Age of Onset  . Hypertension Father      Review of Systems  Constitutional: positive for fever, chills, diaphoresis, activity change, appetite change, fatigue but no unexpected weight change.  HENT: Negative for congestion, sore throat, rhinorrhea, sneezing, trouble swallowing and sinus pressure.  Eyes: Negative for photophobia and visual disturbance.  Respiratory: Negative for  cough, chest tightness, shortness of breath, wheezing and stridor.  Cardiovascular: Negative for chest pain, palpitations and leg swelling.  Gastrointestinal: Negative for nausea, vomiting, abdominal pain, diarrhea, constipation, blood in stool, abdominal distention and anal bleeding.  Genitourinary: Negative for dysuria, hematuria, flank pain and difficulty urinating.  Musculoskeletal: Negative for myalgias, back pain, joint swelling, arthralgias and gait problem.  Skin: Negative for color change, pallor, rash and wound.  Neurological: positive for HA Hematological: Negative for adenopathy. Does not bruise/bleed easily.  Psychiatric/Behavioral: Negative for behavioral problems, confusion, sleep disturbance, dysphoric mood, decreased concentration and agitation.     OBJECTIVE: Temp:  [97.6 F (36.4 C)-98.5 F (36.9 C)] 98.1 F (36.7 C) (06/08 0835) Pulse Rate:  [52-67] 67 (06/08 0835) Resp:  [16-20] 18 (06/08 0835) BP: (102-131)/(54-86) 131/63 mmHg (06/08 0835) SpO2:  [96 %-100 %] 97 % (06/08 0835) FiO2 (%):  [21 %] 21 % (06/07 2144) Weight:  [197 lb 8.5 oz (89.6 kg)] 197 lb 8.5 oz (89.6 kg) (06/07 2258)  Physical Exam  Constitutional: He is oriented to person, place, and time. He appears well-developed and well-nourished. No distress.  HENT:  Mouth/Throat: Oropharynx is clear and moist. No oropharyngeal exudate.  Cardiovascular: Normal rate, regular rhythm and normal heart sounds. Exam reveals no gallop and no friction rub.  No murmur heard.  Pulmonary/Chest: Effort normal and breath sounds normal. No respiratory distress. He has no wheezes.  Abdominal: Soft. Bowel sounds are normal. He exhibits no distension. There is no tenderness.  Lymphadenopathy:  He has no cervical adenopathy.  Neurological: He is alert and oriented to person, place, and time.  Skin: Skin is warm and dry. No rash noted. No erythema.  Psychiatric: He has a normal mood and affect. His behavior is normal.    Ext: right calf slightly trace edema > left leg   LABS: Results for orders placed during the hospital encounter of 03/29/13 (from the past 48 hour(s))  CBC WITH DIFFERENTIAL     Status: Abnormal   Collection Time    03/29/13  8:25 PM      Result Value Range   WBC 1.8 (*) 4.0 - 10.5 K/uL   RBC 4.67  4.22 - 5.81 MIL/uL   Hemoglobin 13.5  13.0 - 17.0 g/dL   HCT 16.1  09.6 - 04.5 %   MCV 87.6  78.0 - 100.0 fL   MCH 28.9  26.0 - 34.0 pg   MCHC 33.0  30.0 - 36.0 g/dL   RDW 40.9  81.1 - 91.4 %   Platelets 73 (*) 150 - 400 K/uL   Comment: PLATELET COUNT CONFIRMED BY SMEAR     LARGE PLATELETS PRESENT   Neutrophils Relative % 48  43 - 77 %   Lymphocytes Relative 40  12 - 46 %   Monocytes Relative 8  3 - 12 %   Eosinophils  Relative 0  0 - 5 %   Basophils Relative 4 (*) 0 - 1 %   Neutro Abs 0.9 (*) 1.7 - 7.7 K/uL   Lymphs Abs 0.7  0.7 - 4.0 K/uL   Monocytes Absolute 0.1  0.1 - 1.0 K/uL   Eosinophils Absolute 0.0  0.0 - 0.7 K/uL   Basophils Absolute 0.1  0.0 - 0.1 K/uL   RBC Morphology TEARDROP CELLS     WBC Morphology INCREASED BANDS (>20% BANDS)     Smear Review LARGE PLATELETS PRESENT    COMPREHENSIVE METABOLIC PANEL     Status: Abnormal   Collection Time    03/29/13  9:04 PM      Result Value Range   Sodium 132 (*) 135 - 145 mEq/L   Potassium 4.8  3.5 - 5.1 mEq/L   Chloride 99  96 - 112 mEq/L   CO2 30  19 - 32 mEq/L   Glucose, Bld 111 (*) 70 - 99 mg/dL   BUN 10  6 - 23 mg/dL   Creatinine, Ser 1.61  0.50 - 1.35 mg/dL   Calcium 8.7  8.4 - 09.6 mg/dL   Total Protein 5.6 (*) 6.0 - 8.3 g/dL   Albumin 3.3 (*) 3.5 - 5.2 g/dL   AST 045 (*) 0 - 37 U/L   ALT 109 (*) 0 - 53 U/L   Alkaline Phosphatase 148 (*) 39 - 117 U/L   Total Bilirubin 0.5  0.3 - 1.2 mg/dL   GFR calc non Af Amer 87 (*) >90 mL/min   GFR calc Af Amer >90  >90 mL/min   Comment:            The eGFR has been calculated     using the CKD EPI equation.     This calculation has not been     validated in all clinical      situations.     eGFR's persistently     <90 mL/min signify     possible Chronic Kidney Disease.  URINALYSIS, ROUTINE W REFLEX MICROSCOPIC     Status: None   Collection Time    03/29/13  9:20 PM      Result Value Range   Color, Urine YELLOW  YELLOW   APPearance CLEAR  CLEAR   Specific Gravity, Urine 1.013  1.005 - 1.030   pH 7.0  5.0 - 8.0   Glucose, UA NEGATIVE  NEGATIVE mg/dL   Hgb urine dipstick NEGATIVE  NEGATIVE   Bilirubin Urine NEGATIVE  NEGATIVE   Ketones, ur NEGATIVE  NEGATIVE mg/dL   Protein, ur NEGATIVE  NEGATIVE mg/dL   Urobilinogen, UA 0.2  0.0 - 1.0 mg/dL   Nitrite NEGATIVE  NEGATIVE   Leukocytes, UA NEGATIVE  NEGATIVE   Comment: MICROSCOPIC NOT DONE ON URINES WITH NEGATIVE PROTEIN, BLOOD, LEUKOCYTES, NITRITE, OR GLUCOSE <1000 mg/dL.  BASIC METABOLIC PANEL     Status: Abnormal   Collection Time    03/30/13  5:20 AM      Result Value Range   Sodium 137  135 - 145 mEq/L   Potassium 4.7  3.5 - 5.1 mEq/L   Chloride 104  96 - 112 mEq/L   CO2 30  19 - 32 mEq/L   Glucose, Bld 98  70 - 99 mg/dL   BUN 9  6 - 23 mg/dL   Creatinine, Ser 4.09  0.50 - 1.35 mg/dL   Calcium 8.1 (*) 8.4 - 10.5 mg/dL   GFR calc non Af Amer 88 (*) >  90 mL/min   GFR calc Af Amer >90  >90 mL/min   Comment:            The eGFR has been calculated     using the CKD EPI equation.     This calculation has not been     validated in all clinical     situations.     eGFR's persistently     <90 mL/min signify     possible Chronic Kidney Disease.  CBC     Status: Abnormal   Collection Time    03/30/13  5:20 AM      Result Value Range   WBC 1.7 (*) 4.0 - 10.5 K/uL   RBC 4.55  4.22 - 5.81 MIL/uL   Hemoglobin 13.2  13.0 - 17.0 g/dL   HCT 16.1  09.6 - 04.5 %   MCV 87.7  78.0 - 100.0 fL   MCH 29.0  26.0 - 34.0 pg   MCHC 33.1  30.0 - 36.0 g/dL   RDW 40.9  81.1 - 91.4 %   Platelets 68 (*) 150 - 400 K/uL   Comment: CONSISTENT WITH PREVIOUS RESULT  DIFFERENTIAL     Status: None   Collection Time     03/30/13  5:20 AM      Result Value Range   Neutrophils Relative % PENDING  43 - 77 %   Neutro Abs PENDING  1.7 - 7.7 K/uL   Band Neutrophils PENDING  0 - 10 %   Lymphocytes Relative PENDING  12 - 46 %   Lymphs Abs PENDING  0.7 - 4.0 K/uL   Monocytes Relative PENDING  3 - 12 %   Monocytes Absolute PENDING  0.1 - 1.0 K/uL   Eosinophils Relative PENDING  0 - 5 %   Eosinophils Absolute PENDING  0.0 - 0.7 K/uL   Basophils Relative PENDING  0 - 1 %   Basophils Absolute PENDING  0.0 - 0.1 K/uL   WBC Morphology PENDING     RBC Morphology PENDING     Smear Review PENDING     nRBC PENDING  0 /100 WBC   Metamyelocytes Relative PENDING     Myelocytes PENDING     Promyelocytes Absolute PENDING     Blasts PENDING    HEPATIC FUNCTION PANEL     Status: Abnormal   Collection Time    03/30/13  5:20 AM      Result Value Range   Total Protein 5.4 (*) 6.0 - 8.3 g/dL   Albumin 2.9 (*) 3.5 - 5.2 g/dL   AST 95 (*) 0 - 37 U/L   ALT 98 (*) 0 - 53 U/L   Alkaline Phosphatase 138 (*) 39 - 117 U/L   Total Bilirubin 0.4  0.3 - 1.2 mg/dL   Bilirubin, Direct 0.2  0.0 - 0.3 mg/dL   Indirect Bilirubin 0.2 (*) 0.3 - 0.9 mg/dL    MICRO: 6/7 blood cx x 2 6/7 blood smear: prelim no malaria  IMAGING: No results found.  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 Started empiric malaria treatment and doxycycline due to leukopenia, thrombocytopenia. Malaria smears pending, although smear is negative from on day 2 of malaria treatment. Mild transaminitis improved but still has leukopenia/neutropenia and thrombocytopenia.  Fevers could be due to malaria, dengue, typhoid fever, chickungunya. Or rickettsial disease due  [place] Once infectious causes are ruled out and if not improvement in symptoms, may need to consider work up  for non-infectious causes  - finish malarone dosing today - continue with doxycycline - will check for dengue serologies, ehrlichiosis serology - will check cbc with diff tomorrow - will check coags, fibrinogen - will check CMV IgM - if has documented fever, please collect 2 sets of blood cultures, repeat blood smear at time of fever  Kaeleb Emond B. Drue Second MD MPH [facility] [phone]

## 2013-03-31 LAB — COMPREHENSIVE METABOLIC PANEL
ALT: 95 U/L — ABNORMAL HIGH (ref 0–53)
AST: 85 U/L — ABNORMAL HIGH (ref 0–37)
Albumin: 2.8 g/dL — ABNORMAL LOW (ref 3.5–5.2)
Alkaline Phosphatase: 142 U/L — ABNORMAL HIGH (ref 39–117)
BUN: 9 mg/dL (ref 6–23)
CO2: 29 mEq/L (ref 19–32)
Calcium: 8.2 mg/dL — ABNORMAL LOW (ref 8.4–10.5)
Chloride: 105 mEq/L (ref 96–112)
Creatinine, Ser: 0.93 mg/dL (ref 0.50–1.35)
GFR calc Af Amer: >90 mL/min (ref >90)
GFR calc non Af Amer: 88 mL/min — ABNORMAL LOW (ref >90)
Glucose, Bld: 97 mg/dL (ref 70–99)
Potassium: 4.4 mEq/L (ref 3.5–5.1)
Sodium: 141 mEq/L (ref 135–145)
Total Bilirubin: 0.3 mg/dL (ref 0.3–1.2)
Total Protein: 5.2 g/dL — ABNORMAL LOW (ref 6.0–8.3)

## 2013-03-31 LAB — CBC WITH DIFFERENTIAL/PLATELET
Basophils Absolute: 0.2 10*3/uL — ABNORMAL HIGH (ref 0.0–0.1)
Basophils Relative: 5 % — ABNORMAL HIGH (ref 0–1)
Eosinophils Absolute: 0 10*3/uL (ref 0.0–0.7)
Eosinophils Relative: 0 % (ref 0–5)
HCT: 37.8 % — ABNORMAL LOW (ref 39.0–52.0)
Hemoglobin: 12.6 g/dL — ABNORMAL LOW (ref 13.0–17.0)
Lymphocytes Relative: 43 % (ref 12–46)
Lymphs Abs: 1.4 10*3/uL (ref 0.7–4.0)
MCH: 29.1 pg (ref 26.0–34.0)
MCHC: 33.3 g/dL (ref 30.0–36.0)
MCV: 87.3 fL (ref 78.0–100.0)
Monocytes Absolute: 0.4 10*3/uL (ref 0.1–1.0)
Monocytes Relative: 12 % (ref 3–12)
Neutro Abs: 1.4 10*3/uL — ABNORMAL LOW (ref 1.7–7.7)
Neutrophils Relative %: 40 % — ABNORMAL LOW (ref 43–77)
Platelets: 94 10*3/uL — ABNORMAL LOW (ref 150–400)
RBC: 4.33 MIL/uL (ref 4.22–5.81)
RDW: 14.5 % (ref 11.5–15.5)
WBC: 3.4 10*3/uL — ABNORMAL LOW (ref 4.0–10.5)

## 2013-03-31 LAB — ROCKY MTN SPOTTED FVR AB, IGG-BLOOD: RMSF IgG: 0.4 IV

## 2013-03-31 LAB — PATHOLOGIST SMEAR REVIEW

## 2013-03-31 LAB — MALARIA SMEAR

## 2013-03-31 LAB — ROCKY MTN SPOTTED FVR AB, IGM-BLOOD: RMSF IgM: 0.56 IV (ref 0.00–0.89)

## 2013-03-31 MED ORDER — DOXYCYCLINE HYCLATE 100 MG PO TABS: 100.0000 mg | ORAL_TABLET | Freq: Two times a day (BID) | ORAL | Status: DC

## 2013-03-31 NOTE — Discharge Summary (Signed)
Physician Discharge Summary  Francisco Rodriguez:[id number] DOB: 1951-09-09 DOA: 03/29/2013  PCP: Daisy Floro, MD  Admit date: 03/29/2013 Discharge date: 03/31/2013  Time spent: 65 minutes  Recommendations for Outpatient Follow-up:  1. Patient is to followup as outpatient with Dr. Drue Second next week. On followup CBC in the compressive metabolic profile need to be obtained to followup on patient's hemoglobin, platelet count, white count, LFTs. Peripheral smear for malaria will need to be followed up upon as outpatient. 2. Patient's family did state that were called by patient's PCP about urine culture and will be prescribed an antibiotic as outpatient. Patient will followup with PCP as needed.  Discharge Diagnoses:  Principal Problem:   Fever Active Problems:   Leukopenia   Thrombocytopenia   Weakness   Tick bite   Discharge Condition: Stable and improved  Diet recommendation: Regular  Filed Weights   03/29/13 2258 03/30/13 2100  Weight: 89.6 kg (197 lb 8.5 oz) 89.5 kg (197 lb 5 oz)    History of present illness:  SHAWNDELL Rodriguez is a 62 y.o. male who returned from [place] 2 weeks ago presenting to the ED with complaints of cyclic fevers Occuring every 8 hours for the past 5 days. He reports headaches, fevers, chills, anorexia, malaise, myalgias and rigors since Tuesday. His wife who is a PA performed a rapid Malaria Test which was negative. He had seen a tick on his body 2 weeks ago as well but he denies seeing a rash on his body . He was taken to the [facility] and evaluated 1 day ago and was found to have leukopenia and thrombocytopenia and was placed on Malarone, and Doxycycline. He returned to the clinic today and had further decrease in his WBCs and PLTs and was referred to the ED. The EDP evaluated and contacted ID on Call Dr.Snyder who recommended continuing the Malarone and Doxycycline. He was referred for medical admission and placed on Neutropenic Precautions.       Hospital Course:  Febrile illness  -Patient reports cyclical fever, he was recently back from [place].  -Patient started on Malarone and doxycycline, his fever is improved  -Presumed malaria, cannot rule out other viral infections including Dengue fever.  -Waiting for peripheral blood smear.  Patient was seen by ID Dr. Drue Second who felt patient's febrile illness could be due to either malaria versus dengue versus tight with fever versus and we are or rickettsial disease due to recent tick exposure in the [place]. Patient was maintained on Malarone and started on doxycycline. CMV IgM was also obtained as well as blood cultures. Patient improved clinically remained afebrile and discharged home on 7 days of doxycycline to complete a ten-day course of antibiotic therapy for presumed tickborne illness. Patient will followup with ID as outpatient. Leukopenia/thrombocytopenia  -This is likely secondary to febrile illness which is likely malaria vs tickborne illness.  -Patient is improving with Malarone and doxycycline.  -Monitored platelets and WBC closely as they're trending down now.  -Patient improved clinically we'll be discharged home in stable and improved condition. On day of discharge patient's white count was up to 3.4 and his platelet count was up to 94. Transaminitis  -Without jaundice, no is hemolysis, his hemoglobin is normal.  -Also likely secondary to his febrile illness malaria versus other viral infections including Dengue fever.  -Malarial infection usually has some degree of hemolysis secondary to intravascular hemolysis/blood sequestration.  Patient's transaminitis trended down and this will need to be followed up upon  as outpatient.     Procedures:  None  Consultations:  Infectious diseases: Dr. Drue Second  Discharge Exam: Filed Vitals:   03/30/13 1509 03/30/13 2100 03/31/13 0539 03/31/13 0900  BP: 113/66 122/61 107/54 109/59  Pulse: 59 63 70 62  Temp: 98.4 F  (36.9 C) 99.4 F (37.4 C) 98.4 F (36.9 C) 97.6 F (36.4 C)  TempSrc: Oral Oral Oral Other (Comment)  Resp: 18 18 18 17   Height:      Weight:  89.5 kg (197 lb 5 oz)    SpO2: 97% 97% 96% 95%    General: NAD Cardiovascular: RRR Respiratory: CTAB  Discharge Instructions  Discharge Orders   Future Appointments Provider Department Dept Phone   04/10/2013 1:30 PM Kandis Cocking, MD [facility], [place] [phone]   Future Orders Complete By Expires     Diet general  As directed     Discharge instructions  As directed     Comments:      Follow up with Dr Drue Second in 1 week. Dr Feliz Beam office will call with appointment time.    Increase activity slowly  As directed         Medication List    STOP taking these medications       MALARONE 250-100 MG Tabs  Generic drug:  atovaquone-proguanil      TAKE these medications       Acetaminophen 500 MG coapsule  Take 1,000 mg by mouth every 6 (six) hours as needed for fever.     amitriptyline 50 MG tablet  Commonly known as:  ELAVIL  Take 50 mg by mouth at bedtime.     aspirin EC 81 MG tablet  Take 81 mg by mouth daily.     Cholecalciferol 5000 UNITS Tabs  Take 5,000 Units by mouth 3 (three) times a week.     doxycycline 100 MG tablet  Commonly known as:  VIBRA-TABS  Take 1 tablet (100 mg total) by mouth 2 (two) times daily. Take for 7 more days then stop.     ibuprofen 800 MG tablet  Commonly known as:  ADVIL,MOTRIN  Take 800 mg by mouth daily as needed (migraine).     rosuvastatin 5 MG tablet  Commonly known as:  CRESTOR  Take 5 mg by mouth every other day.     SUMAtriptan 100 MG tablet  Commonly known as:  IMITREX  Take 50 mg by mouth every 2 (two) hours as needed for migraine.     timolol 0.5 % ophthalmic solution  Commonly known as:  BETIMOL  1 drop 2 (two) times daily.     TUMS ULTRA 1000 PO  Take 1 tablet by mouth daily.       Allergies  Allergen Reactions  . Gluten Meal        Follow-up  Information   Follow up with Judyann Munson, MD On 04/10/2013. (Office will with appointment time.)    Contact information:   [address] [place] [address] [phone]        The results of significant diagnostics from this hospitalization (including imaging, microbiology, ancillary and laboratory) are listed below for reference.    Significant Diagnostic Studies: No results found.  Microbiology: Recent Results (from the past 240 hour(s))  CULTURE, BLOOD (ROUTINE X 2)     Status: None   Collection Time    03/29/13  7:53 PM      Result Value Range Status   Specimen Description BLOOD RIGHT  ARM   Final   Special Requests BOTTLES DRAWN AEROBIC AND ANAEROBIC 10CC EACH   Final   Culture  Setup Time 03/30/2013 05:17   Final   Culture     Final   Value:        BLOOD CULTURE RECEIVED NO GROWTH TO DATE CULTURE WILL BE HELD FOR 5 DAYS BEFORE ISSUING A FINAL NEGATIVE REPORT   Report Status PENDING   Incomplete  MALARIA SMEAR     Status: None   Collection Time    03/29/13  9:04 PM      Result Value Range Status   Specimen Description Blood   Final   Special Requests NONE   Final   Malaria Prep     Final   Value: No Plasmodium or Other Blood Parasites Seen on Thick or Thin Smears For persons strongly suspected of having a blood parasite,but have negative smears, it is recommended that blood films be repeated approximately every 12 to 24 hours for 3 consecutive      days.   Report Status 03/31/2013 FINAL   Final  CULTURE, BLOOD (ROUTINE X 2)     Status: None   Collection Time    03/29/13  9:05 PM      Result Value Range Status   Specimen Description BLOOD RIGHT FOREARM   Final   Special Requests     Final   Value: BOTTLES DRAWN AEROBIC AND ANAEROBIC 10CC AEROBIC 5CC ANAEROBIC   Culture  Setup Time 03/30/2013 05:17   Final   Culture     Final   Value:        BLOOD CULTURE RECEIVED NO GROWTH TO DATE CULTURE WILL BE HELD FOR 5 DAYS BEFORE ISSUING A FINAL NEGATIVE  REPORT   Report Status PENDING   Incomplete     Labs: Basic Metabolic Panel:  Recent Labs Lab 03/29/13 2104 03/30/13 0520 03/31/13 0545  NA 132* 137 141  K 4.8 4.7 4.4  CL 99 104 105  CO2 30 30 29   GLUCOSE 111* 98 97  BUN 10 9 9   CREATININE 0.97 0.93 0.93  CALCIUM 8.7 8.1* 8.2*   Liver Function Tests:  Recent Labs Lab 03/29/13 2104 03/30/13 0520 03/31/13 0545  AST 133* 95* 85*  ALT 109* 98* 95*  ALKPHOS 148* 138* 142*  BILITOT 0.5 0.4 0.3  PROT 5.6* 5.4* 5.2*  ALBUMIN 3.3* 2.9* 2.8*   No results found for this basename: LIPASE, AMYLASE,  in the last 168 hours No results found for this basename: AMMONIA,  in the last 168 hours CBC:  Recent Labs Lab 03/29/13 2025 03/30/13 0520 03/31/13 0545  WBC 1.8* 1.7* 3.4*  NEUTROABS 0.9*  --  1.4*  HGB 13.5 13.2 12.6*  HCT 40.9 39.9 37.8*  MCV 87.6 87.7 87.3  PLT 73* 68* 94*   Cardiac Enzymes: No results found for this basename: CKTOTAL, CKMB, CKMBINDEX, TROPONINI,  in the last 168 hours BNP: BNP (last 3 results) No results found for this basename: PROBNP,  in the last 8760 hours CBG: No results found for this basename: GLUCAP,  in the last 168 hours     Signed:  Zanden Colver  [facility] 03/31/2013, 2:19 PM

## 2013-03-31 NOTE — Progress Notes (Signed)
    [facility]    Date of Admission:  03/29/2013   Total days of antibiotics 4        Day 4 doxy           ID: Francisco Rodriguez is a 62 y.o. male with  Neutropenia/thrombocytopenia with fevers concerning for RMSF, tick borne illness vs. Malaria since he is a returned traveler  Principal Problem:   Fever Active Problems:   Leukopenia   Thrombocytopenia   Weakness   Tick bite    Subjective: Afebrile, feeling much better, looking forward to getting home  Medications:  . atorvastatin  10 mg Oral q1800  . calcium carbonate  2 tablet Oral Daily  . cholecalciferol  5,000 Units Oral 3 times weekly  . doxycycline (VIBRAMYCIN) IV  100 mg Intravenous Q12H  . timolol  1 drop Both Eyes BID    Objective: Vital signs in last 24 hours: Temp:  [97.6 F (36.4 C)-99.4 F (37.4 C)] 97.6 F (36.4 C) (06/09 0900) Pulse Rate:  [59-70] 62 (06/09 0900) Resp:  [17-18] 17 (06/09 0900) BP: (107-122)/(54-66) 109/59 mmHg (06/09 0900) SpO2:  [95 %-97 %] 95 % (06/09 0900) Weight:  [197 lb 5 oz (89.5 kg)] 197 lb 5 oz (89.5 kg) (06/08 2100) Physical Exam  Constitutional: He is oriented to person, place, and time. He appears well-developed and well-nourished. No distress.  HENT:  Mouth/Throat: Oropharynx is clear and moist. No oropharyngeal exudate.  Cardiovascular: Normal rate, regular rhythm and normal heart sounds. Exam reveals no gallop and no friction rub.  No murmur heard.  Pulmonary/Chest: Effort normal and breath sounds normal. No respiratory distress. He has no wheezes.  Lymphadenopathy:  no cervical adenopathy.  Neurological: He is alert and oriented to person, place, and time.  Skin: Skin is warm and dry. No rash noted. No erythema.  Psychiatric: He has a normal mood and affect. His behavior is normal.    Lab Results  Recent Labs  03/30/13 0520 03/31/13 0545  WBC 1.7* 3.4*  HGB 13.2 12.6*  HCT 39.9 37.8*  NA 137 141  K 4.7 4.4  CL 104 105  CO2 30 29    BUN 9 9  CREATININE 0.93 0.93   Liver Panel  Recent Labs  03/29/13 2104 03/30/13 0520 03/31/13 0545  PROT 5.6* 5.4* 5.2*  ALBUMIN 3.3* 2.9* 2.8*  AST 133* 95* 85*  ALT 109* 98* 95*  ALKPHOS 148* 138* 142*  BILITOT 0.5 0.4 0.3  BILIDIR  --  0.2  --   IBILI  --  0.2*  --     Microbiology: 6/7 blood cx NGTD ( had been on doxy x 2 doses)  Studies/Results: No results found.   Assessment/Plan: Febrile illness in returned traveller with leukopenia/neutropenia and thrombocytopenia = appears to be improving by physical exam as well as labs. Can discharge home today to finish out 10 day course of doxycycline for presumed tick borne illness. Will need to follow up on [facility] malaria smear from 6/6.  Will see back in id clinic in 10 days.  Drue Second [facility] Cell: [phone] Pager: [phone]  03/31/2013, 12:36 PM

## 2013-03-31 NOTE — Progress Notes (Signed)
UR COMPLETED  

## 2013-03-31 NOTE — Progress Notes (Signed)
Pt signed discharge papers, IV removed. Room checked for belongings. Escorted off unit via NT.

## 2013-04-02 LAB — CMV IGM: CMV IgM: <8 AU/mL (ref <30.00)

## 2013-04-03 LAB — MISCELLANEOUS TEST

## 2013-04-03 LAB — EHRLICHIA ANTIBODY PANEL

## 2013-04-05 LAB — CULTURE, BLOOD (ROUTINE X 2)
Culture: NO GROWTH
Culture: NO GROWTH

## 2013-04-07 LAB — MISCELLANEOUS TEST

## 2013-04-10 ENCOUNTER — Inpatient Hospital Stay: Payer: Managed Care, Other (non HMO) | Admitting: Internal Medicine

## 2013-04-10 ENCOUNTER — Ambulatory Visit (INDEPENDENT_AMBULATORY_CARE_PROVIDER_SITE_OTHER): Payer: Managed Care, Other (non HMO) | Admitting: Surgery

## 2013-04-10 ENCOUNTER — Ambulatory Visit (INDEPENDENT_AMBULATORY_CARE_PROVIDER_SITE_OTHER): Payer: Managed Care, Other (non HMO) | Admitting: Internal Medicine

## 2013-04-10 ENCOUNTER — Encounter: Payer: Self-pay | Admitting: Internal Medicine

## 2013-04-10 ENCOUNTER — Encounter (INDEPENDENT_AMBULATORY_CARE_PROVIDER_SITE_OTHER): Payer: Self-pay | Admitting: Surgery

## 2013-04-10 VITALS — BP 120/77 | HR 65 | Temp 97.8°F | Wt 196.0 lb

## 2013-04-10 VITALS — BP 132/66 | HR 100 | Resp 14 | Ht 71.0 in | Wt 195.6 lb

## 2013-04-10 DIAGNOSIS — R509 Fever, unspecified: Secondary | ICD-10-CM

## 2013-04-10 DIAGNOSIS — N390 Urinary tract infection, site not specified: Secondary | ICD-10-CM

## 2013-04-10 DIAGNOSIS — K8020 Calculus of gallbladder without cholecystitis without obstruction: Secondary | ICD-10-CM | POA: Insufficient documentation

## 2013-04-10 MED ORDER — FOSFOMYCIN TROMETHAMINE 3 G PO PACK: 3.0000 g | PACK | Freq: Once | ORAL | Status: DC

## 2013-04-10 NOTE — Progress Notes (Addendum)
Re:   Francisco Rodriguez DOB:   10-06-1951 MRN:   [id number]  ASSESSMENT AND PLAN: 1.  Gall bladder disease  1.1 cm gallstone on CT scan from [place]  I discussed with the patient the indications and risks of gall bladder surgery.  The primary risks of gall bladder surgery include, but are not limited to, bleeding, infection, common bile duct injury, and open surgery.   We discussed the typical post-operative recovery course. I tried to answer the patient's questions.  I gave the patient literature about gall bladder surgery.  The patient's recent febrile illness and elevated liver function tests seem unrelated to the gall bladder.  The patient is asymptomatic from this gall stone.    I discussed that in the [place] most of the time we do not operate on asymptomatic gall stones.  Ms. Francisco is in a unique situation in that he works [place] with limited medical facilities and limited resources for emergency care.  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  Though he probably will work for only a few more years and it is unlikely that the gall stone will cause trouble, the limited medical care available to him in [place] makes the risks of watching this much higher than if he lived in the [place]  Therefore, I think he would probably be best served by having an elective cholecystectomy prior to returning to [place].  Mr. Lackey will have to make this decision about surgery and he plans to pass my office note on to the physician at the [facility], Dr. Colin Benton. [Called back to schedule surgery early August}  2.  Febrile illness - etiology unclear  But appears to be getting better.  Further titer results are pending at this time. 3.  Gluten intolerance,   Possible Celiac disease 4.  Thrombocytopenia - secondary to recent febrile illness.  Plts 94,000 - 03/29/2013  I would like to see these return towards normal  before any elective surgery. 5.  Hypercholesterolemia.  On Crestor.  Chief Complaint  Patient presents with  . New Evaluation    eval GB   REFERRING PHYSICIAN: Daisy Floro, MD  HISTORY OF PRESENT ILLNESS: Francisco Rodriguez is a 62 y.o. (DOB: 09-27-1951) white bearded  male whose primary care physician is Daisy Floro, MD and comes to me today for evaluation of gall bladder disese.  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   He got a physical in [place] upon leaving [place] to come home this summer for a sabbatical.  He had some hematuria and underwent a  CT scan in [place] in April 2014.  The CT scan was okay, except they saw a 1.1 cm gall stone seen on CT scan at [facility], [place] - 01/24/2013.  Read by a Dr. Janan Halter.  He brought the disk to our office.  The patient has had no GI symptoms, no fatty food intolerance, no abdominal pain.    Unfortunately, upon arriving back in the [place] He develped a cyclical fever of uncertain etiology.  He also had some exposure to ticks.  He has been treated for possible malaria, though his titers have been okay, and been placed on doxycycline.  He was hospitalized form 03/29/2013 to 03/31/2013 at [facility].  He was seen by Dr. Lowell Guitar (ID).  He was found to have thrombocytopenia, leukopenia, and elevated LFT's (though his bilirubin was normal).  Test for RMSF, Malaria, Dengue fever, Lyme disease, CMV, and Ehrlichia CHAFFEENSIS have been negative so far.  He got more blood work today, which is not back. According to the patient, he is dong much better from this cyclical febrile illness and is getting his strength back. Dr. Colin Benton is the physician at [facility] for the [facility].    Past Medical History  Diagnosis Date  . Bacterial UTI   . Gallstones   . Celiac disease       Past Surgical History  Procedure Laterality Date  .  Cataract extraction, bilateral    . Transurethral resection of prostate        Current Outpatient Prescriptions  Medication Sig Dispense Refill  . Acetaminophen 500 MG coapsule Take 1,000 mg by mouth every 6 (six) hours as needed for fever.      Marland Kitchen amitriptyline (ELAVIL) 50 MG tablet Take 50 mg by mouth at bedtime.      Marland Kitchen aspirin EC 81 MG tablet Take 81 mg by mouth daily.      . Calcium Carbonate Antacid (TUMS ULTRA 1000 PO) Take 1 tablet by mouth daily.      . Cholecalciferol 5000 UNITS TABS Take 5,000 Units by mouth 3 (three) times a week.      Marland Kitchen ibuprofen (ADVIL,MOTRIN) 800 MG tablet Take 800 mg by mouth daily as needed (migraine).      . rosuvastatin (CRESTOR) 5 MG tablet Take 5 mg by mouth every other day.      . SUMAtriptan (IMITREX) 100 MG tablet Take 50 mg by mouth every 2 (two) hours as needed for migraine.      . timolol (BETIMOL) 0.5 % ophthalmic solution 1 drop 2 (two) times daily.      . fosfomycin (MONUROL) 3 G PACK Take 3 g by mouth once.  3 g  2   No current facility-administered medications for this visit.      Allergies  Allergen Reactions  . Gluten Meal     REVIEW OF SYSTEMS: Skin:  No history of rash.  No history of abnormal moles. Infection:  Recent cyclic febrile illness whose etiology is unclear.  But has responded to treatment. Neurologic:  No history of stroke.  No history of seizure.  No history of headaches. Cardiac:  No history of hypertension. No history of heart disease.  No history of seeing a cardiologist. Pulmonary:  Does not smoke cigarettes.  No asthma or bronchitis.  No OSA/CPAP.  Endocrine:  No diabetes. No thyroid disease.  Hypercholesterolemia. Gastrointestinal:  No history of stomach disease.  No history of liver disease.  No history of pancreas disease.  No history of colon disease.  See HPI.  History of negative colonoscopy. Urologic:  History of repeat UTI's.  HIstory of TURP in 2001.  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   History of hematuria.  Etiology unclear. His brother had kidney cancer. Musculoskeletal:  No history of joint or back disease. Hematologic:  Leukopenia and thrombocytopenia from recent illness. Psycho-social:  The patient is oriented.   The patient has no obvious psychologic or social impairment to understanding our conversation and plan.  SOCIAL and FAMILY HISTORY: Married.  His wife is a PA. 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 He is on 3 month sabbatical with plans to return to  [place] at the end of August.  PHYSICAL EXAM: BP 132/66  Pulse 100  Resp 14  Ht 5\' 11"  (1.803 m)  Wt 195 lb 9.6 oz (88.724 kg)  BMI 27.29 kg/m2  General: WN WM who is bearded and who is alert and generally healthy appearing.  HEENT: Normal. Pupils equal. Neck: Supple. No mass.  No thyroid mass. Lymph Nodes:  No supraclavicular or cervical nodes. Lungs: Clear to auscultation and symmetric breath sounds. Heart:  RRR. No murmur or rub. Abdomen: Soft. No mass. No tenderness. No hernia. Normal bowel sounds.  No abdominal scars. Extremities:  Good strength and ROM  in upper and lower extremities. Neurologic:  Grossly intact to motor and sensory function. Psychiatric: Has normal mood and affect. Behavior is normal.   DATA REVIEWED: Data in Epic and notes/disk that the patient brought.  Ovidio Kin, MD,  [facility] [address] [place]  [address]   [place], [place]    [address] Phone:  [phone] FAX:  [phone]

## 2013-04-10 NOTE — Progress Notes (Signed)
[facility] CLINIC NOTE  RFV: hospital follow up for presumed tick borne illness Subjective:    Patient ID: Francisco Rodriguez, male    DOB: [birth year], 62 y.o.   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 After a hike, he and his wife noticed small ticks on their bodies, not firmly attached to skin. No Rash. However, noticed the development of unilateral HA, associated with photophobia and noise sensitivity on Tuesday which persisted to the following day, which is unusual for his migraine history. He noticed developing rigors and Tmax of 103, which cycled every 8-12 hrs. His wife is a PA did a rapid malaria test kit for p.falicaparum was negative x 2 on thur and Friday. Through out Wed-Fri, he had rigors, fever, chills, sweats, broken with tylenol, malaise and myalgia. He saw Dr. Tenny Craw at [facility] walk-in clinic who checked labs which noted leukopenia, thrombocytopenia.  He was started on malarone tx doses, and doxycycline on evening of 6/6. He presented to walk-in clinic on 6/7 feeling worse, labs slightly worsened as well, thus sent to ED for evaluation/ 24hr obs. Doxycycline and malarone continued. He improved during his hospitalization with doxycycline for presumed tick borne illness  He states that he is back to normal health. No fevers, chills, headache or rash.   Current Outpatient Prescriptions on File Prior to Visit  Medication Sig Dispense Refill  . amitriptyline (ELAVIL) 50 MG tablet Take 50 mg by mouth at bedtime.      Marland Kitchen aspirin EC 81 MG tablet Take 81 mg by mouth daily.      . rosuvastatin (CRESTOR) 5 MG tablet Take 5 mg by mouth every other day.      . SUMAtriptan (IMITREX) 100 MG tablet Take 50 mg by mouth every 2 (two) hours as needed for migraine.      . Acetaminophen 500 MG coapsule Take 1,000 mg by  mouth every 6 (six) hours as needed for fever.      . Calcium Carbonate Antacid (TUMS ULTRA 1000 PO) Take 1 tablet by mouth daily.      . Cholecalciferol 5000 UNITS TABS Take 5,000 Units by mouth 3 (three) times a week.      . doxycycline (VIBRA-TABS) 100 MG tablet Take 1 tablet (100 mg total) by mouth 2 (two) times daily. Take for 7 more days then stop.      Marland Kitchen ibuprofen (ADVIL,MOTRIN) 800 MG tablet Take 800 mg by mouth daily as needed (migraine).      . timolol (BETIMOL) 0.5 % ophthalmic solution 1 drop 2 (two) times daily.       No current facility-administered medications on file prior to visit.   History   Social History  . Marital Status: Single    Spouse Name: N/A    Number of Children: N/A  . Years of Education: N/A   Occupational History  . Not on file.   Social History Main Topics  . Smoking status: Never Smoker   . Smokeless tobacco: Never Used  . Alcohol Use: No  . Drug Use: No  . Sexually Active: Yes    Birth Control/ Protection: Other-see comments     Comment: Wife post menopausal   Other Topics Concern  . Not on file   Social History Narrative  . No  narrative on file   family history includes Hypertension in his father.   Review of Systems 12 point ROS is negative    Objective:   Physical Exam BP 120/77  Pulse 65  Temp(Src) 97.8 F (36.6 C) (Oral)  Wt 196 lb (88.905 kg)  BMI 27.35 kg/m2 Physical Exam  Constitutional: He is oriented to person, place, and time. He appears well-developed and well-nourished. No distress.  HENT:  Mouth/Throat: Oropharynx is clear and moist. No oropharyngeal exudate.  Cardiovascular: Normal rate, regular rhythm and normal heart sounds. Exam reveals no gallop and no friction rub.  No murmur heard.  Pulmonary/Chest: Effort normal and breath sounds normal. No respiratory distress. He has no wheezes.  Abdominal: Soft. Bowel sounds are normal. He exhibits no distension. There is no tenderness.  Lymphadenopathy:  He has no  cervical adenopathy.  Neurological: He is alert and oriented to person, place, and time.  Skin: Skin is warm and dry. No rash noted. No erythema.  Psychiatric: He has a normal mood and affect. His behavior is normal.          Assessment & Plan:  Presumed tick borne illness = has finished his doxycycline course. Will check cbc to see that his leukopenia has resolved. We will check RMSF/ehrlichiosis convelescent titer on July 2nd.  Hx of MDR- ecoli UTI = will give them rx for fosfomycin to use in the future when they return back to [place] in September  rtc PRN

## 2013-04-23 ENCOUNTER — Other Ambulatory Visit: Payer: Managed Care, Other (non HMO)

## 2013-04-23 DIAGNOSIS — R509 Fever, unspecified: Secondary | ICD-10-CM

## 2013-04-24 LAB — ROCKY MTN SPOTTED FVR ABS PNL(IGG+IGM)
RMSF IgG: 0.67 IV
RMSF IgM: 0.28 IV

## 2013-04-30 LAB — EHRLICHIA ANTIBODY PANEL
E chaffeensis (HGE) Ab, IgG: 1:64 {titer} — ABNORMAL HIGH
E chaffeensis (HGE) Ab, IgM: <1:20 {titer}

## 2013-05-01 ENCOUNTER — Other Ambulatory Visit (INDEPENDENT_AMBULATORY_CARE_PROVIDER_SITE_OTHER): Payer: Self-pay | Admitting: Surgery

## 2013-05-07 ENCOUNTER — Encounter: Payer: Self-pay | Admitting: Internal Medicine

## 2013-05-13 ENCOUNTER — Encounter (INDEPENDENT_AMBULATORY_CARE_PROVIDER_SITE_OTHER): Payer: Self-pay

## 2013-05-26 ENCOUNTER — Encounter (HOSPITAL_COMMUNITY): Payer: Self-pay | Admitting: Pharmacy Technician

## 2013-05-28 ENCOUNTER — Other Ambulatory Visit (HOSPITAL_COMMUNITY): Payer: Self-pay | Admitting: Surgery

## 2013-05-28 NOTE — Patient Instructions (Addendum)
Francisco Rodriguez  05/28/2013   Your procedure is scheduled on: 06/06/13  FRIDAY   Report to [facility] at  0700     AM.  Call this number if you have problems the morning of surgery: [phone]       Remember:   Do not eat food  Or drink :After Midnight. Thursday NIGHT   Take these medicines the morning of surgery with A SIP OF WATER:No regular meds,   May take Sumatriptan if needed   .  Contacts, dentures or partial plates can not be worn to surgery  Leave suitcase in the car. After surgery it may be brought to your room.  For patients admitted to the hospital, checkout time is 11:00 AM day of  discharge.             SPECIAL INSTRUCTIONS- SEE [place] PREPARING FOR SURGERY INSTRUCTION SHEET-     DO NOT WEAR JEWELRY, LOTIONS, POWDERS, OR PERFUMES.  WOMEN-- DO NOT SHAVE LEGS OR UNDERARMS FOR 12 HOURS BEFORE SHOWERS. MEN MAY SHAVE FACE.  Patients discharged the day of surgery will not be allowed to drive home. IF going home the day of surgery, you must have a driver and someone to stay with you for the first 24 hours  Name and phone number of your driver:   Overnight stay                                                                                                                                                     Seleen Walter  PST [phone]  [phone]                 FAILURE TO FOLLOW THESE INSTRUCTIONS MAY RESULT IN  CANCELLATION   OF YOUR SURGERY                                                  Patient Signature _____________________________

## 2013-05-29 ENCOUNTER — Encounter (HOSPITAL_COMMUNITY): Payer: Self-pay

## 2013-05-29 ENCOUNTER — Encounter (HOSPITAL_COMMUNITY)
Admission: RE | Admit: 2013-05-29 | Discharge: 2013-05-29 | Disposition: A | Discharge status: Home / Self Care | Payer: Managed Care, Other (non HMO) | Source: Ambulatory Visit | Attending: Surgery | Admitting: Surgery

## 2013-05-29 ENCOUNTER — Ambulatory Visit (HOSPITAL_COMMUNITY)
Admission: RE | Admit: 2013-05-29 | Discharge: 2013-05-29 | Disposition: A | Discharge status: Home / Self Care | Payer: Managed Care, Other (non HMO) | Source: Ambulatory Visit | Attending: Surgery | Admitting: Surgery

## 2013-05-29 DIAGNOSIS — M4120 Other idiopathic scoliosis, site unspecified: Secondary | ICD-10-CM | POA: Insufficient documentation

## 2013-05-29 DIAGNOSIS — I10 Essential (primary) hypertension: Secondary | ICD-10-CM | POA: Insufficient documentation

## 2013-05-29 DIAGNOSIS — Z01812 Encounter for preprocedural laboratory examination: Secondary | ICD-10-CM | POA: Insufficient documentation

## 2013-05-29 DIAGNOSIS — Z01818 Encounter for other preprocedural examination: Secondary | ICD-10-CM | POA: Insufficient documentation

## 2013-05-29 DIAGNOSIS — Z01810 Encounter for preprocedural cardiovascular examination: Secondary | ICD-10-CM | POA: Insufficient documentation

## 2013-05-29 DIAGNOSIS — I498 Other specified cardiac arrhythmias: Secondary | ICD-10-CM | POA: Insufficient documentation

## 2013-05-29 HISTORY — DX: Headache: R51

## 2013-05-29 HISTORY — DX: Peripheral vascular disease, unspecified: I73.9

## 2013-05-29 HISTORY — DX: Other specified disorders of bone density and structure, unspecified site: M85.80

## 2013-05-29 HISTORY — DX: Other complications of anesthesia, initial encounter: T88.59XA

## 2013-05-29 HISTORY — DX: Major depressive disorder, single episode, unspecified: F32.9

## 2013-05-29 HISTORY — DX: Depression, unspecified: F32.A

## 2013-05-29 HISTORY — DX: Unspecified glaucoma: H40.9

## 2013-05-29 HISTORY — DX: Adverse effect of unspecified anesthetic, initial encounter: T41.45XA

## 2013-05-29 HISTORY — DX: Reserved for concepts with insufficient information to code with codable children: IMO0002

## 2013-05-29 LAB — COMPREHENSIVE METABOLIC PANEL
ALT: 19 U/L (ref 0–53)
AST: 16 U/L (ref 0–37)
Albumin: 3.8 g/dL (ref 3.5–5.2)
Alkaline Phosphatase: 78 U/L (ref 39–117)
BUN: 12 mg/dL (ref 6–23)
CO2: 34 mEq/L — ABNORMAL HIGH (ref 19–32)
Calcium: 9.5 mg/dL (ref 8.4–10.5)
Chloride: 103 mEq/L (ref 96–112)
Creatinine, Ser: 0.93 mg/dL (ref 0.50–1.35)
GFR calc Af Amer: >90 mL/min (ref >90)
GFR calc non Af Amer: 88 mL/min — ABNORMAL LOW (ref >90)
Glucose, Bld: 67 mg/dL — ABNORMAL LOW (ref 70–99)
Potassium: 4.5 mEq/L (ref 3.5–5.1)
Sodium: 141 mEq/L (ref 135–145)
Total Bilirubin: 0.3 mg/dL (ref 0.3–1.2)
Total Protein: 6.6 g/dL (ref 6.0–8.3)

## 2013-05-29 LAB — CBC WITH DIFFERENTIAL/PLATELET
Basophils Absolute: 0 10*3/uL (ref 0.0–0.1)
Basophils Relative: 1 % (ref 0–1)
Eosinophils Absolute: 0 10*3/uL (ref 0.0–0.7)
Eosinophils Relative: 1 % (ref 0–5)
HCT: 46.7 % (ref 39.0–52.0)
Hemoglobin: 15.1 g/dL (ref 13.0–17.0)
Lymphocytes Relative: 32 % (ref 12–46)
Lymphs Abs: 1.9 10*3/uL (ref 0.7–4.0)
MCH: 29.3 pg (ref 26.0–34.0)
MCHC: 32.3 g/dL (ref 30.0–36.0)
MCV: 90.5 fL (ref 78.0–100.0)
Monocytes Absolute: 0.4 10*3/uL (ref 0.1–1.0)
Monocytes Relative: 7 % (ref 3–12)
Neutro Abs: 3.6 10*3/uL (ref 1.7–7.7)
Neutrophils Relative %: 61 % (ref 43–77)
Platelets: 238 10*3/uL (ref 150–400)
RBC: 5.16 MIL/uL (ref 4.22–5.81)
RDW: 13.7 % (ref 11.5–15.5)
WBC: 5.9 10*3/uL (ref 4.0–10.5)

## 2013-05-29 NOTE — Progress Notes (Signed)
Spoke with patient and wife who stated he had lunch, had eaten breakfast and was "OK"- asymptomatic with blood sugar from this AM

## 2013-06-06 ENCOUNTER — Encounter (HOSPITAL_COMMUNITY): Payer: Self-pay | Admitting: Anesthesiology

## 2013-06-06 ENCOUNTER — Telehealth (INDEPENDENT_AMBULATORY_CARE_PROVIDER_SITE_OTHER): Payer: Self-pay

## 2013-06-06 ENCOUNTER — Ambulatory Visit (HOSPITAL_COMMUNITY): Payer: 59

## 2013-06-06 ENCOUNTER — Ambulatory Visit (HOSPITAL_COMMUNITY)
Admission: RE | Admit: 2013-06-06 | Discharge: 2013-06-06 | Disposition: A | Discharge status: Home / Self Care | Payer: 59 | Source: Ambulatory Visit | Attending: Surgery | Admitting: Surgery

## 2013-06-06 ENCOUNTER — Ambulatory Visit (HOSPITAL_COMMUNITY): Payer: 59 | Admitting: Anesthesiology

## 2013-06-06 ENCOUNTER — Encounter (HOSPITAL_COMMUNITY)
Admission: RE | Disposition: A | Discharge status: Home / Self Care | Payer: Self-pay | Source: Ambulatory Visit | Attending: Surgery

## 2013-06-06 ENCOUNTER — Encounter (HOSPITAL_COMMUNITY): Payer: Self-pay | Admitting: *Deleted

## 2013-06-06 DIAGNOSIS — Z79899 Other long term (current) drug therapy: Secondary | ICD-10-CM | POA: Insufficient documentation

## 2013-06-06 DIAGNOSIS — Z9079 Acquired absence of other genital organ(s): Secondary | ICD-10-CM | POA: Insufficient documentation

## 2013-06-06 DIAGNOSIS — R7989 Other specified abnormal findings of blood chemistry: Secondary | ICD-10-CM | POA: Insufficient documentation

## 2013-06-06 DIAGNOSIS — K8010 Calculus of gallbladder with chronic cholecystitis without obstruction: Secondary | ICD-10-CM

## 2013-06-06 DIAGNOSIS — E7800 Pure hypercholesterolemia, unspecified: Secondary | ICD-10-CM | POA: Insufficient documentation

## 2013-06-06 DIAGNOSIS — K8020 Calculus of gallbladder without cholecystitis without obstruction: Secondary | ICD-10-CM

## 2013-06-06 DIAGNOSIS — Z9849 Cataract extraction status, unspecified eye: Secondary | ICD-10-CM | POA: Insufficient documentation

## 2013-06-06 DIAGNOSIS — D72819 Decreased white blood cell count, unspecified: Secondary | ICD-10-CM | POA: Insufficient documentation

## 2013-06-06 DIAGNOSIS — K900 Celiac disease: Secondary | ICD-10-CM | POA: Insufficient documentation

## 2013-06-06 DIAGNOSIS — D6959 Other secondary thrombocytopenia: Secondary | ICD-10-CM | POA: Insufficient documentation

## 2013-06-06 HISTORY — PX: CHOLECYSTECTOMY: SHX55

## 2013-06-06 IMAGING — RF DG CHOLANGIOGRAM OPERATIVE
1 series · 8 of 8 positions shown · non-contrast
Comparison: None.

CLINICAL DATA: Intraoperative cholangiogram

INTRAOPERATIVE CHOLANGIOGRAM
TECHNIQUE: Multiple fluoroscopic spot radiographs were obtained
during intraoperative cholangiogram and are submitted for
interpretation post-operatively.

[Series 1: run · 2 acquisitions, 8 frames shown]
[im 1/2]
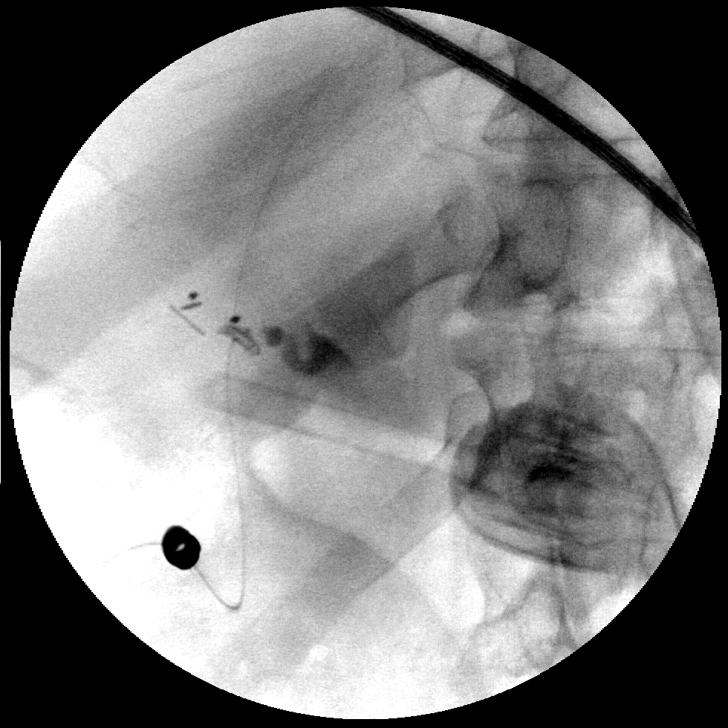
[im 1/2]
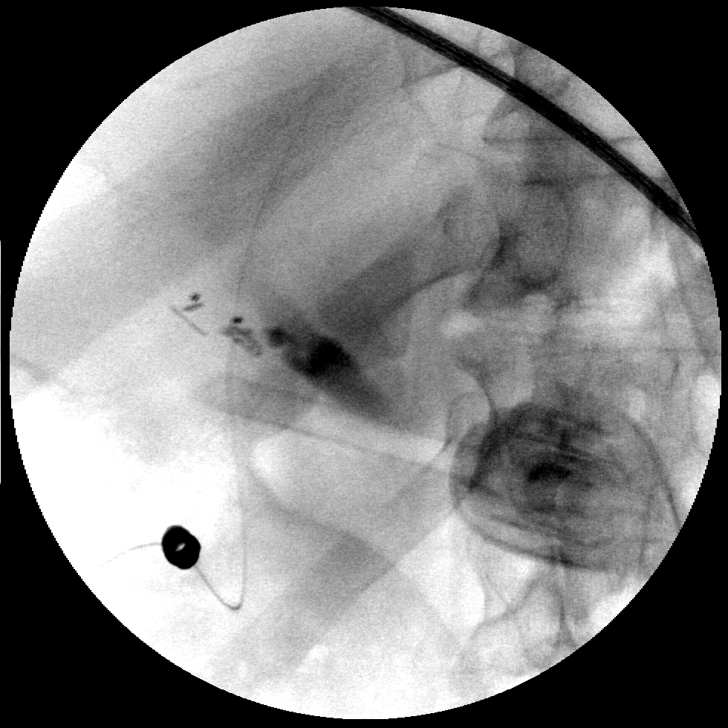
[im 1/2]
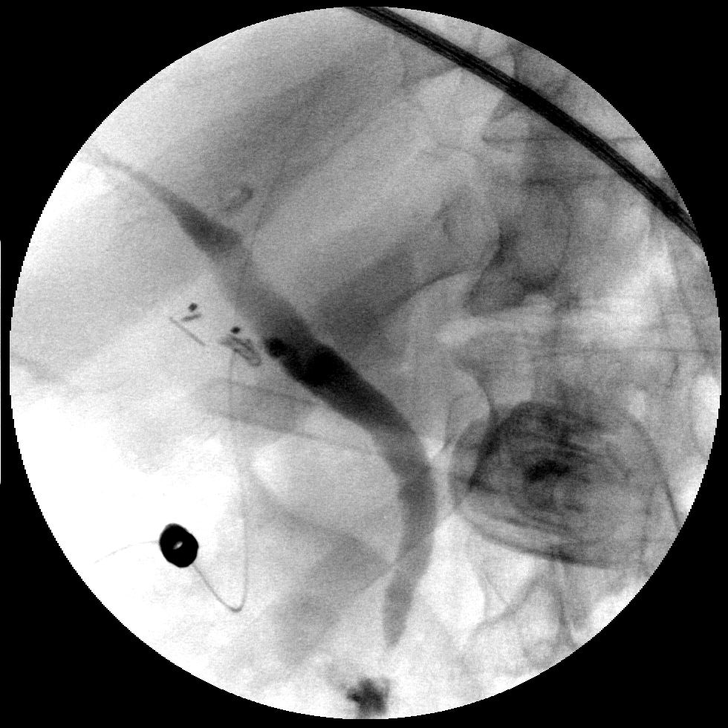
[im 1/2]
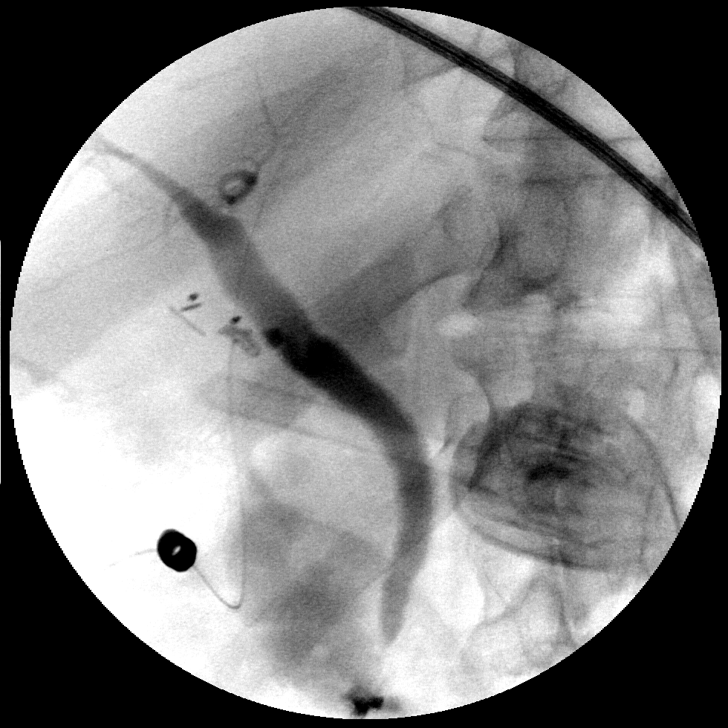
[im 2/2]
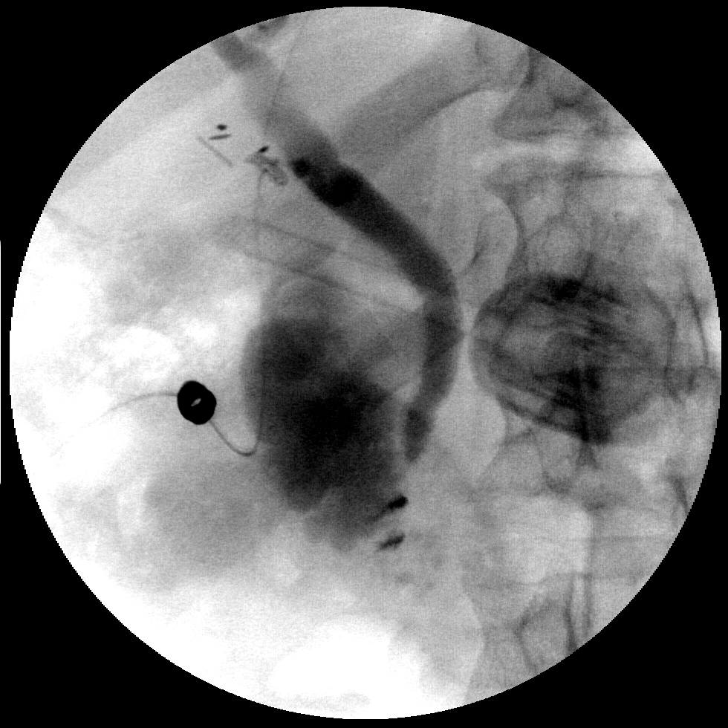
[im 2/2]
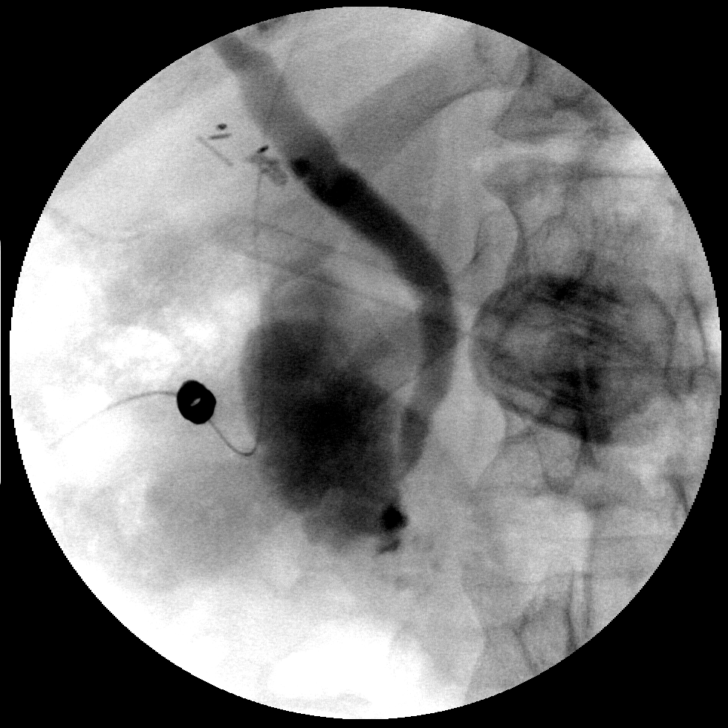
[im 2/2]
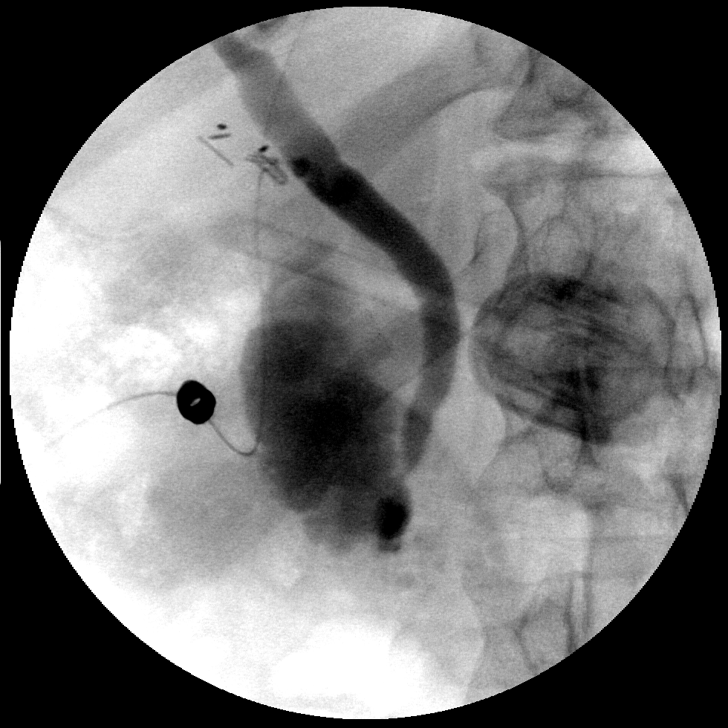
[im 2/2]
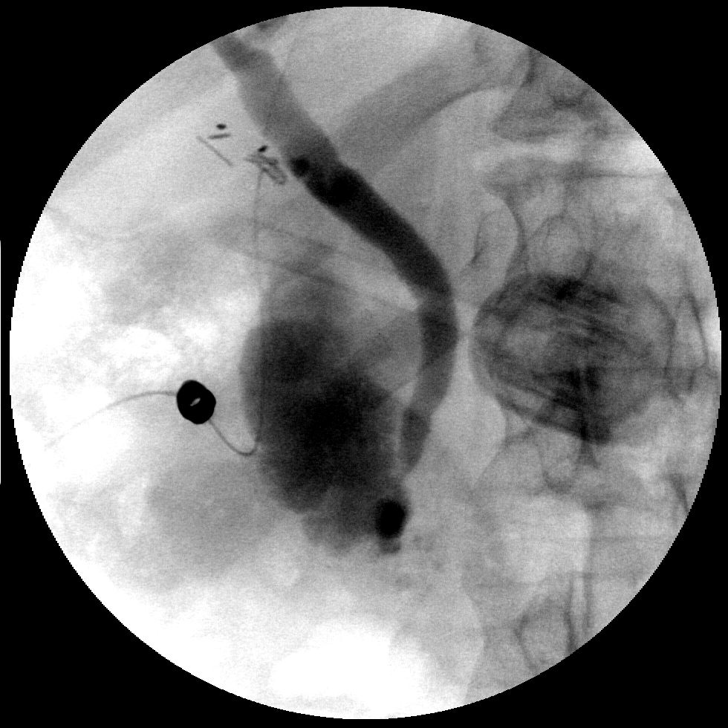

[8 of 8 positions shown; findings below may reference images not displayed]

FINDINGS: The common bile duct and intrahepatic ducts are normal in
caliber and smooth.  There are no filling defects.  Contrast flows
freely into the duodenum.  There is no contrast extravasation.
IMPRESSION: No evidence of a duct stones.  Normal intraoperative cholangiogram.

## 2013-06-06 SURGERY — LAPAROSCOPIC CHOLECYSTECTOMY WITH INTRAOPERATIVE CHOLANGIOGRAM
Anesthesia: General | Site: Abdomen | Wound class: Clean Contaminated

## 2013-06-06 MED ORDER — OXYCODONE HCL 5 MG PO TABS
5.0000 mg | ORAL_TABLET | Freq: Once | ORAL | Status: AC | PRN
  Administered 2013-06-06: 5 mg via ORAL
  Filled 2013-06-06: qty 1

## 2013-06-06 MED ORDER — LACTATED RINGERS IR SOLN
Status: DC | PRN
  Administered 2013-06-06: 1000 mL

## 2013-06-06 MED ORDER — ONDANSETRON HCL 4 MG/2ML IJ SOLN
INTRAMUSCULAR | Status: DC | PRN
  Administered 2013-06-06: 4 mg via INTRAVENOUS

## 2013-06-06 MED ORDER — LIDOCAINE HCL (CARDIAC) 20 MG/ML IV SOLN
INTRAVENOUS | Status: DC | PRN
  Administered 2013-06-06: 50 mg via INTRAVENOUS

## 2013-06-06 MED ORDER — PROPOFOL 10 MG/ML IV BOLUS
INTRAVENOUS | Status: DC | PRN
  Administered 2013-06-06: 150 mg via INTRAVENOUS

## 2013-06-06 MED ORDER — LACTATED RINGERS IV SOLN
INTRAVENOUS | Status: DC
  Administered 2013-06-06: 10:00:00 via INTRAVENOUS
  Administered 2013-06-06: 1000 mL via INTRAVENOUS
  Administered 2013-06-06: 08:00:00 via INTRAVENOUS

## 2013-06-06 MED ORDER — ACETAMINOPHEN 10 MG/ML IV SOLN
1000.0000 mg | Freq: Once | INTRAVENOUS | Status: AC
  Administered 2013-06-06: 1000 mg via INTRAVENOUS
  Filled 2013-06-06: qty 100

## 2013-06-06 MED ORDER — EPHEDRINE SULFATE 50 MG/ML IJ SOLN
INTRAMUSCULAR | Status: DC | PRN
  Administered 2013-06-06 (×2): 10 mg via INTRAVENOUS

## 2013-06-06 MED ORDER — PROMETHAZINE HCL 25 MG/ML IJ SOLN: 6.2500 mg | INTRAMUSCULAR | Status: DC | PRN

## 2013-06-06 MED ORDER — MIDAZOLAM HCL 5 MG/5ML IJ SOLN
INTRAMUSCULAR | Status: DC | PRN
  Administered 2013-06-06: 2 mg via INTRAVENOUS

## 2013-06-06 MED ORDER — GLYCOPYRROLATE 0.2 MG/ML IJ SOLN
INTRAMUSCULAR | Status: DC | PRN
  Administered 2013-06-06: 0.2 mg via INTRAVENOUS
  Administered 2013-06-06: 0.6 mg via INTRAVENOUS

## 2013-06-06 MED ORDER — HYDROMORPHONE HCL PF 1 MG/ML IJ SOLN: 0.2500 mg | INTRAMUSCULAR | Status: DC | PRN

## 2013-06-06 MED ORDER — IOHEXOL 300 MG/ML  SOLN
INTRAMUSCULAR | Status: DC | PRN
  Administered 2013-06-06: 10 mL

## 2013-06-06 MED ORDER — SUCCINYLCHOLINE CHLORIDE 20 MG/ML IJ SOLN
INTRAMUSCULAR | Status: DC | PRN
  Administered 2013-06-06: 100 mg via INTRAVENOUS

## 2013-06-06 MED ORDER — NEOSTIGMINE METHYLSULFATE 1 MG/ML IJ SOLN
INTRAMUSCULAR | Status: DC | PRN
  Administered 2013-06-06: 4 mg via INTRAVENOUS

## 2013-06-06 MED ORDER — ROCURONIUM BROMIDE 100 MG/10ML IV SOLN
INTRAVENOUS | Status: DC | PRN
  Administered 2013-06-06: 35 mg via INTRAVENOUS

## 2013-06-06 MED ORDER — MEPERIDINE HCL 50 MG/ML IJ SOLN: 6.2500 mg | INTRAMUSCULAR | Status: DC | PRN

## 2013-06-06 MED ORDER — ATROPINE SULFATE 1 MG/ML IJ SOLN
INTRAMUSCULAR | Status: DC | PRN
  Administered 2013-06-06: 0.2 mg via INTRAVENOUS

## 2013-06-06 MED ORDER — HYDROCODONE-ACETAMINOPHEN 5-325 MG PO TABS: 1.0000 | ORAL_TABLET | Freq: Four times a day (QID) | ORAL | Status: DC | PRN

## 2013-06-06 MED ORDER — BUPIVACAINE HCL (PF) 0.25 % IJ SOLN
INTRAMUSCULAR | Status: DC | PRN
  Administered 2013-06-06: 30 mL

## 2013-06-06 MED ORDER — OXYCODONE HCL 5 MG/5ML PO SOLN
5.0000 mg | Freq: Once | ORAL | Status: AC | PRN
  Filled 2013-06-06: qty 5

## 2013-06-06 MED ORDER — CEFAZOLIN SODIUM-DEXTROSE 2-3 GM-% IV SOLR
2.0000 g | INTRAVENOUS | Status: AC
  Administered 2013-06-06: 2 g via INTRAVENOUS

## 2013-06-06 MED ORDER — FENTANYL CITRATE 0.05 MG/ML IJ SOLN
INTRAMUSCULAR | Status: DC | PRN
  Administered 2013-06-06: 50 ug via INTRAVENOUS
  Administered 2013-06-06: 100 ug via INTRAVENOUS

## 2013-06-06 SURGICAL SUPPLY — 40 items
APPLIER CLIP ROT 10 11.4 M/L (STAPLE) ×2
BENZOIN TINCTURE PRP APPL 2/3 (GAUZE/BANDAGES/DRESSINGS) ×2 IMPLANT
CANISTER SUCTION 2500CC (MISCELLANEOUS) ×2 IMPLANT
CHLORAPREP W/TINT 26ML (MISCELLANEOUS) ×2 IMPLANT
CHOLANGIOGRAM CATH TAUT (CATHETERS) ×2 IMPLANT
CLIP APPLIE ROT 10 11.4 M/L (STAPLE) ×1 IMPLANT
CLOTH BEACON ORANGE TIMEOUT ST (SAFETY) ×2 IMPLANT
COVER MAYO STAND STRL (DRAPES) IMPLANT
DECANTER SPIKE VIAL GLASS SM (MISCELLANEOUS) ×2 IMPLANT
DERMABOND ADVANCED (GAUZE/BANDAGES/DRESSINGS) ×1
DERMABOND ADVANCED .7 DNX12 (GAUZE/BANDAGES/DRESSINGS) ×1 IMPLANT
DRAPE C-ARM 42X120 X-RAY (DRAPES) IMPLANT
DRAPE LAPAROSCOPIC ABDOMINAL (DRAPES) ×2 IMPLANT
ELECT REM PT RETURN 9FT ADLT (ELECTROSURGICAL) ×2
ELECTRODE REM PT RTRN 9FT ADLT (ELECTROSURGICAL) ×1 IMPLANT
GLOVE BIOGEL PI IND STRL 7.0 (GLOVE) ×1 IMPLANT
GLOVE BIOGEL PI INDICATOR 7.0 (GLOVE) ×1
GLOVE SURG SIGNA 7.5 PF LTX (GLOVE) ×2 IMPLANT
GOWN STRL NON-REIN LRG LVL3 (GOWN DISPOSABLE) ×2 IMPLANT
GOWN STRL REIN XL XLG (GOWN DISPOSABLE) ×4 IMPLANT
HEMOSTAT SURGICEL 4X8 (HEMOSTASIS) IMPLANT
IV CATH 14GX2 1/4 (CATHETERS) ×2 IMPLANT
IV SET EXT 30 76VOL 4 MALE LL (IV SETS) ×2 IMPLANT
KIT BASIN OR (CUSTOM PROCEDURE TRAY) ×2 IMPLANT
NS IRRIG 1000ML POUR BTL (IV SOLUTION) IMPLANT
POUCH SPECIMEN RETRIEVAL 10MM (ENDOMECHANICALS) IMPLANT
SET IRRIG TUBING LAPAROSCOPIC (IRRIGATION / IRRIGATOR) ×2 IMPLANT
SLEEVE ENDOPATH XCEL 5M (ENDOMECHANICALS) ×6 IMPLANT
SOLUTION ANTI FOG 6CC (MISCELLANEOUS) ×2 IMPLANT
STOPCOCK K 69 2C6206 (IV SETS) ×2 IMPLANT
STRIP CLOSURE SKIN 1/4X4 (GAUZE/BANDAGES/DRESSINGS) ×2 IMPLANT
SUT VIC AB 5-0 PS2 18 (SUTURE) ×2 IMPLANT
TOWEL OR 17X26 10 PK STRL BLUE (TOWEL DISPOSABLE) ×6 IMPLANT
TRAY LAP CHOLE (CUSTOM PROCEDURE TRAY) ×2 IMPLANT
TROCAR BLADELESS OPT 5 100 (ENDOMECHANICALS) ×6 IMPLANT
TROCAR XCEL BLUNT TIP 100MML (ENDOMECHANICALS) ×2 IMPLANT
TROCAR XCEL NON-BLD 11X100MML (ENDOMECHANICALS) ×2 IMPLANT
TROCAR XCEL UNIV SLVE 11M 100M (ENDOMECHANICALS) IMPLANT
TUBING INSUFFLATION 10FT LAP (TUBING) ×2 IMPLANT
WATER STERILE IRR 1500ML POUR (IV SOLUTION) ×2 IMPLANT

## 2013-06-06 NOTE — H&P (Signed)
Re: Francisco Rodriguez  DOB: 12/15/1950  MRN: [id number]   ASSESSMENT AND PLAN:  1. Gall bladder disease   1.1 cm gallstone on CT scan from [place]   I discussed with the patient the indications and risks of gall bladder surgery. The primary risks of gall bladder surgery include, but are not limited to, bleeding, infection, common bile duct injury, and open surgery. We discussed the typical post-operative recovery course. I tried to answer the patient's questions.   I gave the patient literature about gall bladder surgery.  The patient's recent febrile illness and elevated liver function tests seem unrelated to the gall bladder.  The patient is asymptomatic from this gall stone.   I discussed that in the [place] most of the time we do not operate on asymptomatic gall stones.  Ms. Schnitzer is in a unique situation in that he works in [place] with limited medical facilities and limited resources for emergency care. 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 Though he probably will work for only a few more years and it is unlikely that the gall stone will cause trouble, the limited medical care available to him in [place] makes the risks of watching this much higher than if he lived in the [place] Therefore, I think he would probably be best served by having an elective cholecystectomy prior to returning to [place].   Mr. Terriquez will have to make this decision about surgery and he plans to pass my office note on to the physician at the [facility], Dr. Colin Benton.   Wife, Renea Ee, is with him here today.  2. Febrile illness - etiology unclear   Resolved.  Further titer results are pending at this time.  3. Gluten intolerance,   Possible Celiac disease  4. Thrombocytopenia - secondary to recent febrile illness.   Plts 94,000 - 03/29/2013   This has returned to normal - Plts - 238,000 - 05/29/2013 5. Hypercholesterolemia.   On  Crestor.   Chief Complaint   Patient presents with   .  New Evaluation     eval GB    REFERRING PHYSICIAN: Daisy Floro, MD   HISTORY OF PRESENT ILLNESS:  ANTHONYMICHAEL Rodriguez is a 62 y.o. (DOB: [birth year]) white bearded male whose primary care physician is Daisy Floro, MD and comes to me today for evaluation of gall bladder disese.   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  He got a physical in [place] upon leaving [place] to come home this summer for a sabbatical. He had some hematuria and underwent a CT scan in [place] in April 2014. The CT scan was okay, except they saw a 1.1 cm gall stone seen on CT scan at [facility], [place] - 01/24/2013. Read by a Dr. Janan Halter. He brought the disk to our office. The patient has had no GI symptoms, no fatty food intolerance, no abdominal pain.  Unfortunately, upon arriving [place] He develped a cyclical fever of uncertain etiology. He also had some exposure to ticks. He has been treated for possible malaria, though his titers have been okay, and been placed on doxycycline. He was hospitalized form 03/29/2013 to 03/31/2013 at [facility]. He was seen by Dr. Lowell Guitar (ID). He was found to have thrombocytopenia, leukopenia, and elevated LFT's (though his bilirubin was normal). Test  for RMSF, Malaria, Dengue fever, Lyme disease, CMV, and Ehrlichia CHAFFEENSIS have been negative so far. He got more blood work today, which is not back.   According to the patient, he is dong much better from this cyclical febrile illness and is getting his strength back.  Dr. Colin Benton is the physician at [facility] for the [facility].   Past Medical History   Diagnosis  Date   .  Bacterial UTI    .  Gallstones    .  Celiac disease     Past Surgical History   Procedure  Laterality  Date   .  Cataract extraction, bilateral     .  Transurethral  resection of prostate      Current Outpatient Prescriptions   Medication  Sig  Dispense  Refill   .  Acetaminophen 500 MG coapsule  Take 1,000 mg by mouth every 6 (six) hours as needed for fever.     Marland Kitchen  amitriptyline (ELAVIL) 50 MG tablet  Take 50 mg by mouth at bedtime.     Marland Kitchen  aspirin EC 81 MG tablet  Take 81 mg by mouth daily.     .  Calcium Carbonate Antacid (TUMS ULTRA 1000 PO)  Take 1 tablet by mouth daily.     .  Cholecalciferol 5000 UNITS TABS  Take 5,000 Units by mouth 3 (three) times a week.     Marland Kitchen  ibuprofen (ADVIL,MOTRIN) 800 MG tablet  Take 800 mg by mouth daily as needed (migraine).     .  rosuvastatin (CRESTOR) 5 MG tablet  Take 5 mg by mouth every other day.     .  SUMAtriptan (IMITREX) 100 MG tablet  Take 50 mg by mouth every 2 (two) hours as needed for migraine.     .  timolol (BETIMOL) 0.5 % ophthalmic solution  1 drop 2 (two) times daily.     .  fosfomycin (MONUROL) 3 G PACK  Take 3 g by mouth once.  3 g  2    No current facility-administered medications for this visit.    Allergies   Allergen  Reactions   .  Gluten Meal     REVIEW OF SYSTEMS:  Skin: No history of rash. No history of abnormal moles.  Infection: Recent cyclic febrile illness whose etiology is unclear. But has responded to treatment.  Neurologic: No history of stroke. No history of seizure. No history of headaches.  Cardiac: No history of hypertension. No history of heart disease. No history of seeing a cardiologist.  Pulmonary: Does not smoke cigarettes. No asthma or bronchitis. No OSA/CPAP.  Endocrine: No diabetes. No thyroid disease. Hypercholesterolemia.  Gastrointestinal: No history of stomach disease. No history of liver disease. No history of pancreas disease. No history of colon disease. See HPI. History of negative colonoscopy.  Urologic: History of repeat UTI's. HIstory of TURP in 2001. 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 History of hematuria. Etiology unclear. His brother had  kidney cancer.  Musculoskeletal: No history of joint or back disease.  Hematologic: Leukopenia and thrombocytopenia from recent illness.  Psycho-social: The patient is oriented. The patient has no obvious psychologic or social impairment to understanding our conversation and plan.   SOCIAL and FAMILY HISTORY:  Married. His wife is a PA.  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  He is on 3 month sabbatical with plans to return to [place] at the  end of August.   PHYSICAL EXAM:  BP 123/70  Pulse 58  Temp(Src) 97.3 F (36.3 C) (Oral)  Resp 18  SpO2 99%  Ht 5\' 11"  (1.803 m)  Wt 195 lb 9.6 oz (88.724 kg)  BMI 27.29 kg/m2   General: WN WM who is bearded and who is alert and generally healthy appearing.  HEENT: Normal. Pupils equal.  Neck: Supple. No mass. No thyroid mass.  Lymph Nodes: No supraclavicular or cervical nodes.  Lungs: Clear to auscultation and symmetric breath sounds.  Heart: RRR. No murmur or rub.  Abdomen: Soft. No mass. No tenderness. No hernia. Normal bowel sounds. No abdominal scars.  Extremities: Good strength and ROM in upper and lower extremities.  Neurologic: Grossly intact to motor and sensory function.  Psychiatric: Has normal mood and affect. Behavior is normal.   DATA REVIEWED:  Data in Epic and notes/disk that the patient brought.   Ovidio Kin, MD, [facility]  [address] [place]  [place], [place] [address]  Phone: [phone] FAX: [phone]

## 2013-06-06 NOTE — Op Note (Signed)
06/06/2013  10:37 AM  PATIENT:  Francisco Rodriguez, 62 y.o., male, MRN: [id number]  PREOP DIAGNOSIS:  gall stones   POSTOP DIAGNOSIS:   Cholelithiasis, chronic cholecystitis  PROCEDURE:   Procedure(s): LAPAROSCOPIC CHOLECYSTECTOMY WITH INTRAOPERATIVE CHOLANGIOGRAM  SURGEON:   Ovidio Kin, M.D.  ASSISTANT:   A. Maisie Fus, MD  ANESTHESIA:   general  Anesthesiologist: Gaylan Gerold, MD CRNA: Stephanie C [place], CRNA  General  ASA: @asa @  EBL:  Minimal  ml  BLOOD ADMINISTERED: none  DRAINS: none   LOCAL MEDICATIONS USED:   30 cc 1/4% marcaine  SPECIMEN:   Gall bladder  COUNTS CORRECT:  YES  INDICATIONS FOR PROCEDURE:  Francisco Rodriguez is a 62 y.o. (DOB: December 27, 1950) white  male whose primary care physician is Daisy Floro, MD and comes for cholecystectomy.   The indications and risks of the gall bladder surgery were explained to the patient.  The risks include, but are not limited to, infection, bleeding, common bile duct injury and open surgery.  SURGERY:  The patient was taken to room #1 at [facility].  The abdomen was prepped with chloroprep.  The patient was given 2 gm Ancef at the beginning of the operation.   A time out was held and the surgical checklist run.   An infraumbilical incision was made into the abdominal cavity.  A 12 mm Hasson trocar was inserted into the abdominal cavity through the infraumbilical incision and secured with a 0 Vicryl suture.  Three additional trocars were inserted: a 5 mm trocar in the sub-xiphoid location, a 5 mm trocar in the right mid subcostal area, and a 5 mm trocar in the right lateral subcostal area.   The abdomen was explored and the liver, stomach, and bowel that could be seen were unremarkable.   The gall bladder was identified, grasped, and rotated cephalad.  Disssection was carried down to the gall bladder/cystic duct junction and the cystic duct isolated.  A clip was placed on the gall bladder side of the cystic duct.   An  intra-operative cholangiogram was shot.   The intra-operative cholangiogram was shot using a cut off Taut catheter placed through a 14 gauge angiocath in the RUQ.  The Taut catheter was inserted in the cut cystic duct and secured with an endoclip.  A cholangiogram was shot with 10 cc of 1/2 strength Omnipaque.  Using fluoroscopy, the cholangiogram showed the flow of contrast into the common bile duct, up the hepatic radicals, and into the duodenum.  There was no mass or obstruction.  This was a normal intra-operative cholangiogram.   The Taut catheter was removed.  The cystic duct was tripley endoclipped and the cystic artery was identified and clipped.  The gall bladder was bluntly and sharpley dissected from the gall bladder bed.   After the gall bladder was removed from the liver, the gall bladder bed and Triangle of [place] were inspected.  There was no bleeding or bile leak.  The gall bladder was placed in a endocatch bag and delivered through the umbilicus.  The abdomen was irrigated with 1,000 cc saline.   The trocars were then removed.  I infiltrated 30cc of 1/4% Marcaine into the incisions.  The umbilical port closed with a 0 Vicryl suture and the skin closed with 5-0 vicryl.  The skin was painted with Dermabond.  The patient's sponge and needle count were correct.  The patient was transported to the RR in good condition.   The plan is to let the  patient go home today.  Ovidio Kin, MD, [facility] Surgery Pager: [phone] Office phone:  [phone]

## 2013-06-06 NOTE — Telephone Encounter (Signed)
P/O appt with Dr. Ezzard Standing 06/12/13@4 :45 To call to confirm

## 2013-06-06 NOTE — Anesthesia Postprocedure Evaluation (Signed)
Anesthesia Post Note  Patient: Francisco Rodriguez  Procedure(s) Performed: Procedure(s) (LRB): LAPAROSCOPIC CHOLECYSTECTOMY WITH INTRAOPERATIVE CHOLANGIOGRAM (N/A)  Anesthesia type: General  Patient location: PACU  Post pain: Pain level controlled  Post assessment: Post-op Vital signs reviewed  Last Vitals: BP 131/75  Pulse 49  Temp(Src) 36.6 C (Oral)  Resp 14  SpO2 93%  Post vital signs: Reviewed  Level of consciousness: sedated  Complications: No apparent anesthesia complications

## 2013-06-06 NOTE — Transfer of Care (Signed)
Immediate Anesthesia Transfer of Care Note  Patient: Francisco Rodriguez  Procedure(s) Performed: Procedure(s): LAPAROSCOPIC CHOLECYSTECTOMY WITH INTRAOPERATIVE CHOLANGIOGRAM (N/A)  Patient Location: PACU  Anesthesia Type:General  Level of Consciousness: awake and alert   Airway & Oxygen Therapy: Patient Spontanous Breathing and Patient connected to face mask oxygen  Post-op Assessment: Report given to PACU RN and Post -op Vital signs reviewed and stable  Post vital signs: Reviewed and stable  Complications: No apparent anesthesia complications

## 2013-06-06 NOTE — Anesthesia Preprocedure Evaluation (Addendum)
Anesthesia Evaluation  Patient identified by MRN, date of birth, ID band Patient awake    Reviewed: Allergy & Precautions, H&P , NPO status , Patient's Chart, lab work & pertinent test results  History of Anesthesia Complications (+) PONV  Airway Mallampati: II TM Distance: >3 FB Neck ROM: Limited    Dental  (+) Dental Advisory Given and Teeth Intact   Pulmonary neg pulmonary ROS,  breath sounds clear to auscultation- rhonchi        Cardiovascular hypertension, Pt. on medications DVT - Peripheral Vascular Disease negative cardio ROS  Rhythm:Regular Rate:Normal     Neuro/Psych  Headaches, PSYCHIATRIC DISORDERS Depression    GI/Hepatic Neg liver ROS, GERD-  Medicated,  Endo/Other  negative endocrine ROS  Renal/GU negative Renal ROS     Musculoskeletal negative musculoskeletal ROS (+)   Abdominal   Peds  Hematology negative hematology ROS (+)   Anesthesia Other Findings   Reproductive/Obstetrics                          Anesthesia Physical Anesthesia Plan  ASA: II  Anesthesia Plan: General   Post-op Pain Management:    Induction: Intravenous  Airway Management Planned: Oral ETT  Additional Equipment:   Intra-op Plan:   Post-operative Plan: Extubation in OR  Informed Consent: I have reviewed the patients History and Physical, chart, labs and discussed the procedure including the risks, benefits and alternatives for the proposed anesthesia with the patient or authorized representative who has indicated his/her understanding and acceptance.   Dental advisory given  Plan Discussed with: CRNA  Anesthesia Plan Comments:         Anesthesia Quick Evaluation

## 2013-06-09 ENCOUNTER — Encounter (HOSPITAL_COMMUNITY): Payer: Self-pay | Admitting: Surgery

## 2013-06-12 ENCOUNTER — Encounter (INDEPENDENT_AMBULATORY_CARE_PROVIDER_SITE_OTHER): Payer: Self-pay | Admitting: Surgery

## 2013-06-12 ENCOUNTER — Ambulatory Visit (INDEPENDENT_AMBULATORY_CARE_PROVIDER_SITE_OTHER): Payer: Managed Care, Other (non HMO) | Admitting: Surgery

## 2013-06-12 VITALS — BP 126/76 | HR 67 | Temp 98.0°F | Resp 18 | Ht 71.5 in | Wt 193.0 lb

## 2013-06-12 DIAGNOSIS — K8020 Calculus of gallbladder without cholecystitis without obstruction: Secondary | ICD-10-CM

## 2013-06-12 NOTE — Progress Notes (Signed)
Re:   Francisco Rodriguez DOB:   07/20/1951 MRN:   [id number]  ASSESSMENT AND PLAN: 1.  Gall bladder disease  Lap chole - 06/06/2013 - D. Ezzard Standing  He has done well.  I filled out a form for [employer] [place].  I released him from my care and told him he can lift 50 pounds next week.  I also gave the patient copies of his path report and labs (which had returned to normal)  Return to office PRN.  2.  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  Gluten intolerance,   Possible Celiac disease 4.  Thrombocytopenia -   This has resolved.  I gave the patient copies of his lab report. 5.  Hypercholesterolemia.  On Crestor.  Chief Complaint  Patient presents with  . Routine Post Op   REFERRING PHYSICIAN: Daisy Floro, MD  HISTORY OF PRESENT ILLNESS: Francisco Rodriguez is a 62 y.o. (DOB: Mar 06, 1951) white bearded  male whose primary care physician is Daisy Floro, MD and comes to me today for follow up of gall bladder surgery.  He has done well from the surgery.  His wife is with him.  He should do well going back to [place].  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   He got a physical in [place] upon leaving [place] to come home this summer for a sabbatical.  He had some hematuria and underwent a  CT scan in [place] in April 2014.  The CT scan was okay, except they saw a 1.1 cm gall stone seen on CT scan at [facility], [place] - 01/24/2013.  Read by a Dr. Janan Halter.  He brought the disk to our office.  The patient has had no GI symptoms, no fatty food intolerance, no abdominal pain.    Unfortunately, upon arriving back in the [place] He develped a cyclical fever of uncertain etiology.  He also had some exposure to ticks.  He has been treated for possible malaria, though his titers have been okay, and been placed on doxycycline.  He was hospitalized form  03/29/2013 to 03/31/2013 at [facility].  He was seen by Dr. Lowell Guitar (ID).  He was found to have thrombocytopenia, leukopenia, and elevated LFT's (though his bilirubin was normal).  Test for RMSF, Malaria, Dengue fever, Lyme disease, CMV, and Ehrlichia CHAFFEENSIS have been negative so far.  He got more blood work today, which is not back. According to the patient, he is dong much better from this cyclical febrile illness and is getting his strength back. Dr. Colin Benton is the physician at [facility] for the [facility].    Past Medical History  Diagnosis Date  . Bacterial UTI   . Gallstones   . Celiac disease   . GERD (gastroesophageal reflux disease)   . Hyperlipidemia   . Complication of anesthesia 1994?    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 Hypertension     history of left ventricular hypertrophy with hypertension meds/ since  gluten allergy lost weight and off meds- per wife hypertrophy resolved  . Depression     from gluten allergy  . Glaucoma   . Peripheral vascular disease     hx right DVT post op  . Headache(784.0)     migraines  . Osteopenia   . Urethral stenosis   .  Herniated disc     lumbar    Current Outpatient Prescriptions  Medication Sig Dispense Refill  . amitriptyline (ELAVIL) 50 MG tablet Take 50 mg by mouth at bedtime.      Marland Kitchen aspirin EC 81 MG tablet Take 81 mg by mouth daily.      . Calcium Carbonate Antacid (TUMS ULTRA 1000 PO) Take 1 tablet by mouth daily.      . Cholecalciferol 5000 UNITS TABS Take 5,000 Units by mouth every Monday, Wednesday, and Friday.       Marland Kitchen ibuprofen (ADVIL,MOTRIN) 800 MG tablet Take 800 mg by mouth daily as needed (migraine).      . rosuvastatin (CRESTOR) 5 MG tablet Take 5 mg by mouth every other day.      . SUMAtriptan (IMITREX) 100 MG tablet Take 50 mg by mouth every 2 (two) hours as needed for migraine.      . timolol (BETIMOL) 0.5 % ophthalmic solution Place 1  drop into both eyes 2 (two) times daily.       Marland Kitchen HYDROcodone-acetaminophen (NORCO/VICODIN) 5-325 MG per tablet Take 1-2 tablets by mouth every 6 (six) hours as needed for pain.  30 tablet  1   No current facility-administered medications for this visit.      Allergies  Allergen Reactions  . Gluten Meal Swelling    migraines    REVIEW OF SYSTEMS: Infection:  Recent cyclic febrile illness whose etiology is unclear.  But has responded to treatment. Endocrine:  No diabetes. No thyroid disease.  Hypercholesterolemia. Gastrointestinal:  No history of stomach disease.  No history of liver disease.  No history of pancreas disease.  No history of colon disease.  See HPI.  History of negative colonoscopy. Urologic:  History of repeat UTI's.  HIstory of TURP in 2001.  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  History of hematuria.  Etiology unclear. His brother had kidney cancer. Musculoskeletal:  No history of joint or back disease. Hematologic:  Leukopenia and thrombocytopenia from recent illness.  SOCIAL and FAMILY HISTORY: Married.  His wife is a PA. 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 He is on 3 month sabbatical with plans to return to [place] at the end of August.  PHYSICAL EXAM: BP 126/76  Pulse 67  Temp(Src) 98 F (36.7 C)  Resp 18  Ht 5' 11.5" (1.816 m)  Wt 193 lb (87.544 kg)  BMI 26.55 kg/m2  General: WN WM who is bearded and who is alert and generally healthy appearing.  HEENT: Normal. Pupils equal. Lungs: Clear to auscultation and symmetric breath sounds. Heart:  RRR. No murmur or rub. Abdomen: Soft. No mass. No tenderness. Incisions look good.  DATA REVIEWED: Path and lab report to patient.  Ovidio Kin, MD,  [facility] [address] [place]  [address]   [place], [place]    [address] Phone:  [phone] FAX:  [phone]

## 2013-08-28 ENCOUNTER — Other Ambulatory Visit: Payer: Self-pay

## 2015-02-02 ENCOUNTER — Other Ambulatory Visit: Payer: Self-pay | Admitting: Family Medicine

## 2015-02-02 DIAGNOSIS — R51 Headache: Principal | ICD-10-CM

## 2015-02-02 DIAGNOSIS — R519 Headache, unspecified: Secondary | ICD-10-CM

## 2015-02-04 ENCOUNTER — Ambulatory Visit
Admission: RE | Admit: 2015-02-04 | Discharge: 2015-02-04 | Disposition: A | Discharge status: Home / Self Care | Payer: 59 | Source: Ambulatory Visit | Attending: Family Medicine | Admitting: Family Medicine

## 2015-02-04 DIAGNOSIS — R519 Headache, unspecified: Secondary | ICD-10-CM

## 2015-02-04 DIAGNOSIS — R51 Headache: Principal | ICD-10-CM

## 2015-06-24 DIAGNOSIS — G43009 Migraine without aura, not intractable, without status migrainosus: Secondary | ICD-10-CM | POA: Insufficient documentation

## 2015-06-24 DIAGNOSIS — R519 Headache, unspecified: Secondary | ICD-10-CM | POA: Insufficient documentation

## 2015-10-19 DIAGNOSIS — M26609 Unspecified temporomandibular joint disorder, unspecified side: Secondary | ICD-10-CM | POA: Insufficient documentation

## 2015-11-17 DIAGNOSIS — Z8619 Personal history of other infectious and parasitic diseases: Secondary | ICD-10-CM | POA: Diagnosis not present

## 2015-11-17 DIAGNOSIS — G43909 Migraine, unspecified, not intractable, without status migrainosus: Secondary | ICD-10-CM | POA: Diagnosis not present

## 2015-11-17 DIAGNOSIS — Z0000 Encounter for general adult medical examination without abnormal findings: Secondary | ICD-10-CM | POA: Diagnosis not present

## 2015-11-17 DIAGNOSIS — Z0001 Encounter for general adult medical examination with abnormal findings: Secondary | ICD-10-CM | POA: Diagnosis not present

## 2015-11-17 DIAGNOSIS — R319 Hematuria, unspecified: Secondary | ICD-10-CM | POA: Diagnosis not present

## 2015-11-17 DIAGNOSIS — M8580 Other specified disorders of bone density and structure, unspecified site: Secondary | ICD-10-CM | POA: Diagnosis not present

## 2015-11-17 DIAGNOSIS — R413 Other amnesia: Secondary | ICD-10-CM | POA: Diagnosis not present

## 2015-11-17 DIAGNOSIS — E785 Hyperlipidemia, unspecified: Secondary | ICD-10-CM | POA: Diagnosis not present

## 2015-11-17 DIAGNOSIS — R600 Localized edema: Secondary | ICD-10-CM | POA: Diagnosis not present

## 2015-11-22 DIAGNOSIS — Z8619 Personal history of other infectious and parasitic diseases: Secondary | ICD-10-CM | POA: Diagnosis not present

## 2015-11-22 DIAGNOSIS — E785 Hyperlipidemia, unspecified: Secondary | ICD-10-CM | POA: Diagnosis not present

## 2015-11-22 DIAGNOSIS — M8589 Other specified disorders of bone density and structure, multiple sites: Secondary | ICD-10-CM | POA: Diagnosis not present

## 2015-11-22 DIAGNOSIS — R413 Other amnesia: Secondary | ICD-10-CM | POA: Diagnosis not present

## 2015-11-22 DIAGNOSIS — R51 Headache: Secondary | ICD-10-CM | POA: Diagnosis not present

## 2016-02-04 DIAGNOSIS — H524 Presbyopia: Secondary | ICD-10-CM | POA: Diagnosis not present

## 2016-02-04 DIAGNOSIS — Z961 Presence of intraocular lens: Secondary | ICD-10-CM | POA: Diagnosis not present

## 2016-02-04 DIAGNOSIS — H1851 Endothelial corneal dystrophy: Secondary | ICD-10-CM | POA: Diagnosis not present

## 2016-02-04 DIAGNOSIS — H401131 Primary open-angle glaucoma, bilateral, mild stage: Secondary | ICD-10-CM | POA: Diagnosis not present

## 2016-02-04 DIAGNOSIS — G43909 Migraine, unspecified, not intractable, without status migrainosus: Secondary | ICD-10-CM | POA: Diagnosis not present

## 2016-02-23 DIAGNOSIS — R413 Other amnesia: Secondary | ICD-10-CM | POA: Diagnosis not present

## 2016-02-23 DIAGNOSIS — R41844 Frontal lobe and executive function deficit: Secondary | ICD-10-CM | POA: Diagnosis not present

## 2016-02-23 DIAGNOSIS — G3184 Mild cognitive impairment, so stated: Secondary | ICD-10-CM | POA: Diagnosis not present

## 2016-02-23 DIAGNOSIS — Z7982 Long term (current) use of aspirin: Secondary | ICD-10-CM | POA: Diagnosis not present

## 2016-03-15 DIAGNOSIS — D2312 Other benign neoplasm of skin of left eyelid, including canthus: Secondary | ICD-10-CM | POA: Diagnosis not present

## 2016-03-15 DIAGNOSIS — H401121 Primary open-angle glaucoma, left eye, mild stage: Secondary | ICD-10-CM | POA: Diagnosis not present

## 2016-03-15 DIAGNOSIS — H401111 Primary open-angle glaucoma, right eye, mild stage: Secondary | ICD-10-CM | POA: Diagnosis not present

## 2016-03-15 DIAGNOSIS — H21552 Recession of chamber angle, left eye: Secondary | ICD-10-CM | POA: Diagnosis not present

## 2016-03-16 DIAGNOSIS — R413 Other amnesia: Secondary | ICD-10-CM | POA: Diagnosis not present

## 2016-03-16 DIAGNOSIS — R51 Headache: Secondary | ICD-10-CM | POA: Diagnosis not present

## 2016-03-16 DIAGNOSIS — Z7982 Long term (current) use of aspirin: Secondary | ICD-10-CM | POA: Diagnosis not present

## 2016-03-16 DIAGNOSIS — Z79899 Other long term (current) drug therapy: Secondary | ICD-10-CM | POA: Diagnosis not present

## 2016-03-16 DIAGNOSIS — F22 Delusional disorders: Secondary | ICD-10-CM | POA: Diagnosis not present

## 2016-03-16 DIAGNOSIS — Z5181 Encounter for therapeutic drug level monitoring: Secondary | ICD-10-CM | POA: Diagnosis not present

## 2016-03-24 ENCOUNTER — Other Ambulatory Visit: Payer: Self-pay | Admitting: Ophthalmology

## 2016-03-24 DIAGNOSIS — L821 Other seborrheic keratosis: Secondary | ICD-10-CM | POA: Diagnosis not present

## 2016-03-24 DIAGNOSIS — D2312 Other benign neoplasm of skin of left eyelid, including canthus: Secondary | ICD-10-CM | POA: Diagnosis not present

## 2016-05-04 DIAGNOSIS — Z5181 Encounter for therapeutic drug level monitoring: Secondary | ICD-10-CM | POA: Diagnosis not present

## 2016-07-17 DIAGNOSIS — G3184 Mild cognitive impairment, so stated: Secondary | ICD-10-CM | POA: Diagnosis not present

## 2016-07-17 DIAGNOSIS — R51 Headache: Secondary | ICD-10-CM | POA: Diagnosis not present

## 2016-08-24 DIAGNOSIS — R413 Other amnesia: Secondary | ICD-10-CM | POA: Diagnosis not present

## 2016-08-24 DIAGNOSIS — E785 Hyperlipidemia, unspecified: Secondary | ICD-10-CM | POA: Diagnosis not present

## 2016-08-24 DIAGNOSIS — Z125 Encounter for screening for malignant neoplasm of prostate: Secondary | ICD-10-CM | POA: Diagnosis not present

## 2016-08-24 DIAGNOSIS — Z1159 Encounter for screening for other viral diseases: Secondary | ICD-10-CM | POA: Diagnosis not present

## 2016-08-24 DIAGNOSIS — Z8619 Personal history of other infectious and parasitic diseases: Secondary | ICD-10-CM | POA: Diagnosis not present

## 2016-08-24 DIAGNOSIS — Z0000 Encounter for general adult medical examination without abnormal findings: Secondary | ICD-10-CM | POA: Diagnosis not present

## 2016-08-28 DIAGNOSIS — R030 Elevated blood-pressure reading, without diagnosis of hypertension: Secondary | ICD-10-CM | POA: Diagnosis not present

## 2016-08-28 DIAGNOSIS — R600 Localized edema: Secondary | ICD-10-CM | POA: Diagnosis not present

## 2016-08-28 DIAGNOSIS — G43909 Migraine, unspecified, not intractable, without status migrainosus: Secondary | ICD-10-CM | POA: Diagnosis not present

## 2016-08-28 DIAGNOSIS — Z8619 Personal history of other infectious and parasitic diseases: Secondary | ICD-10-CM | POA: Diagnosis not present

## 2016-08-28 DIAGNOSIS — Z23 Encounter for immunization: Secondary | ICD-10-CM | POA: Diagnosis not present

## 2016-08-28 DIAGNOSIS — M8589 Other specified disorders of bone density and structure, multiple sites: Secondary | ICD-10-CM | POA: Diagnosis not present

## 2016-08-28 DIAGNOSIS — R413 Other amnesia: Secondary | ICD-10-CM | POA: Diagnosis not present

## 2016-08-28 DIAGNOSIS — Z125 Encounter for screening for malignant neoplasm of prostate: Secondary | ICD-10-CM | POA: Diagnosis not present

## 2016-08-28 DIAGNOSIS — Z0000 Encounter for general adult medical examination without abnormal findings: Secondary | ICD-10-CM | POA: Diagnosis not present

## 2016-08-28 DIAGNOSIS — F4329 Adjustment disorder with other symptoms: Secondary | ICD-10-CM | POA: Diagnosis not present

## 2016-08-28 DIAGNOSIS — E785 Hyperlipidemia, unspecified: Secondary | ICD-10-CM | POA: Diagnosis not present

## 2016-09-11 DIAGNOSIS — H401111 Primary open-angle glaucoma, right eye, mild stage: Secondary | ICD-10-CM | POA: Diagnosis not present

## 2016-09-11 DIAGNOSIS — H21552 Recession of chamber angle, left eye: Secondary | ICD-10-CM | POA: Diagnosis not present

## 2016-09-11 DIAGNOSIS — H401121 Primary open-angle glaucoma, left eye, mild stage: Secondary | ICD-10-CM | POA: Diagnosis not present

## 2016-09-26 DIAGNOSIS — Z23 Encounter for immunization: Secondary | ICD-10-CM | POA: Diagnosis not present

## 2016-12-20 DIAGNOSIS — R05 Cough: Secondary | ICD-10-CM | POA: Diagnosis not present

## 2017-02-12 DIAGNOSIS — H401131 Primary open-angle glaucoma, bilateral, mild stage: Secondary | ICD-10-CM | POA: Diagnosis not present

## 2017-02-12 DIAGNOSIS — H21552 Recession of chamber angle, left eye: Secondary | ICD-10-CM | POA: Diagnosis not present

## 2017-04-15 DIAGNOSIS — R509 Fever, unspecified: Secondary | ICD-10-CM | POA: Diagnosis not present

## 2017-04-20 DIAGNOSIS — R51 Headache: Secondary | ICD-10-CM | POA: Diagnosis not present

## 2017-07-16 DIAGNOSIS — G3184 Mild cognitive impairment, so stated: Secondary | ICD-10-CM | POA: Diagnosis not present

## 2017-07-16 DIAGNOSIS — Z79899 Other long term (current) drug therapy: Secondary | ICD-10-CM | POA: Diagnosis not present

## 2017-07-16 DIAGNOSIS — R51 Headache: Secondary | ICD-10-CM | POA: Diagnosis not present

## 2017-08-23 ENCOUNTER — Encounter: Payer: Self-pay | Admitting: Neurology

## 2017-08-31 DIAGNOSIS — R413 Other amnesia: Secondary | ICD-10-CM | POA: Diagnosis not present

## 2017-08-31 DIAGNOSIS — Z125 Encounter for screening for malignant neoplasm of prostate: Secondary | ICD-10-CM | POA: Diagnosis not present

## 2017-08-31 DIAGNOSIS — E785 Hyperlipidemia, unspecified: Secondary | ICD-10-CM | POA: Diagnosis not present

## 2017-08-31 DIAGNOSIS — R7301 Impaired fasting glucose: Secondary | ICD-10-CM | POA: Diagnosis not present

## 2017-08-31 DIAGNOSIS — M85852 Other specified disorders of bone density and structure, left thigh: Secondary | ICD-10-CM | POA: Diagnosis not present

## 2017-09-03 DIAGNOSIS — M8580 Other specified disorders of bone density and structure, unspecified site: Secondary | ICD-10-CM | POA: Diagnosis not present

## 2017-09-03 DIAGNOSIS — Z8619 Personal history of other infectious and parasitic diseases: Secondary | ICD-10-CM | POA: Diagnosis not present

## 2017-09-03 DIAGNOSIS — G43909 Migraine, unspecified, not intractable, without status migrainosus: Secondary | ICD-10-CM | POA: Diagnosis not present

## 2017-09-03 DIAGNOSIS — Z23 Encounter for immunization: Secondary | ICD-10-CM | POA: Diagnosis not present

## 2017-09-03 DIAGNOSIS — Z0000 Encounter for general adult medical examination without abnormal findings: Secondary | ICD-10-CM | POA: Diagnosis not present

## 2017-09-03 DIAGNOSIS — R600 Localized edema: Secondary | ICD-10-CM | POA: Diagnosis not present

## 2017-09-03 DIAGNOSIS — E785 Hyperlipidemia, unspecified: Secondary | ICD-10-CM | POA: Diagnosis not present

## 2017-09-03 DIAGNOSIS — R413 Other amnesia: Secondary | ICD-10-CM | POA: Diagnosis not present

## 2017-12-24 ENCOUNTER — Encounter: Payer: Self-pay | Admitting: Neurology

## 2017-12-24 ENCOUNTER — Ambulatory Visit (INDEPENDENT_AMBULATORY_CARE_PROVIDER_SITE_OTHER): Payer: Medicare Other | Admitting: Neurology

## 2017-12-24 VITALS — BP 120/80 | HR 54 | Ht 70.5 in | Wt 196.4 lb

## 2017-12-24 DIAGNOSIS — G3184 Mild cognitive impairment, so stated: Secondary | ICD-10-CM | POA: Diagnosis not present

## 2017-12-24 DIAGNOSIS — R519 Headache, unspecified: Secondary | ICD-10-CM

## 2017-12-24 DIAGNOSIS — R51 Headache: Secondary | ICD-10-CM

## 2017-12-24 MED ORDER — DONEPEZIL HCL 5 MG PO TABS: 5.0000 mg | ORAL_TABLET | Freq: Every day | ORAL | 3 refills | Status: DC

## 2017-12-24 NOTE — Patient Instructions (Signed)
Continue your medications as you are taking  Stay active   Return to clinic 1 year

## 2017-12-24 NOTE — Progress Notes (Addendum)
[facility] Clinic Note - Initial Visit   Date: 12/24/17  Francisco Rodriguez MRN: [id number] DOB: [birth year]   Dear [name]:  Thank you for your kind referral of Francisco Rodriguez for consultation of headaches. Although her history is well known to you, please allow Francisco Rodriguez to reiterate it for the purpose of our medical record. The patient was accompanied to the clinic by wife who also provides collateral information.     History of Present Illness: Francisco Rodriguez is a 67 y.o. right-handed Caucasian male with glaucoma, hypertension, hyperlipidemia, and depression presenting for evaluation of migraines and mild cognitive impairment.  He was active in international [place] work and retired in 2015 due to neck pain and headaches.  He moved to [place], [place] 2015.  He has been followed by Dr. Aviva Kluver at [facility] for his chronic headaches and MCI and would like to transition his care closer to home.  He has long history of migraines since childhood and was taking OTC medications almost daily for many years.  After establishing care with [name], he advised to taper off OTC and limit to twice per week, which has also significantly improved the severity of his pain.  Currently, he takes takes conenzyme 200mg  daily for headaches.  He continues to have low-grade daily headaches and gets adequate relief with coenzyme 200mg /d which has reduced the intensity of his pain.  He takes imitrex 100mg  about twice per week for severe pain which helps.  Additionally, rest and sleep also helps. He has many food triggers and is vigilant about his diet.    He was diagnosed with mild cognitive impairment by [name], based on neurocognitive testing in 2017.  He was started on Aricept 5mg  daily and has been doing well.  Overall, wife and patient feel his memory is stable.  He sometimes forgets tasks and appointments, especially if he does not write it down.  He manages his  own medications and drives without any safety issues.  He stays very active and walks for 1 hour daily.  He enjoys reading.  Mood and sleep is good.  Out-side paper records, electronic medical record, and images have been reviewed where available and summarized as:  MRI brain wwo contrast 03/29/2015:  Normal pre- and postcontrast MRI of the brain.  NCS/EMG of the legs 07/21/2015:  There is electrodiagnostic evidence for a mild, chronic, axonal sensory motor polyneuropathy.  Formal neuropsychological testing January 2017:  Mild cognitive impairment   Past Medical History:  Diagnosis Date  . Bacterial UTI   . Celiac disease   . Complication of anesthesia 1994?   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 Depression    from gluten allergy  . Gallstones   . GERD (gastroesophageal reflux disease)   . Glaucoma   . Headache(784.0)    migraines  . Herniated disc    lumbar  . Hyperlipidemia   . Hypertension    history of left ventricular hypertrophy with hypertension meds/ since  gluten allergy lost weight and off meds- per wife hypertrophy resolved  . Osteopenia   . Peripheral vascular disease (HCC)    hx right DVT post op  . Urethral stenosis     Past Surgical History:  Procedure Laterality Date  . CATARACT EXTRACTION, BILATERAL    . CHOLECYSTECTOMY N/A 06/06/2013   Procedure: LAPAROSCOPIC CHOLECYSTECTOMY WITH INTRAOPERATIVE CHOLANGIOGRAM;  Surgeon: Shann Medal, MD;  Location: [facility] ORS;  Service: General;  Laterality: N/A;  . cystoscopy with dilitation    . TONSILLECTOMY    . TRANSURETHRAL RESECTION OF PROSTATE       Medications:  Outpatient Encounter Medications as of 12/24/2017  Medication Sig  . aspirin EC 81 MG tablet Take 81 mg by mouth daily.  . Calcium Carbonate Antacid (TUMS ULTRA 1000 PO) Take 1 tablet by mouth daily.  . Cholecalciferol (VITAMIN D3) 1000 units CAPS Take 2,000 Units by mouth.  . donepezil (ARICEPT) 5 MG tablet Take 1 tablet (5  mg total) by mouth at bedtime.  Marland Kitchen ibuprofen (ADVIL,MOTRIN) 800 MG tablet Take 800 mg by mouth daily as needed (migraine).  . Omega-3 Fatty Acids (FISH OIL) 1000 MG CPDR Take by mouth.  . SUMAtriptan (IMITREX) 100 MG tablet Take 50 mg by mouth every 2 (two) hours as needed for migraine.  . timolol (BETIMOL) 0.5 % ophthalmic solution Place 1 drop into both eyes 2 (two) times daily.   . vitamin B-12 (CYANOCOBALAMIN) 500 MCG tablet Take 500 mcg by mouth daily.  . [DISCONTINUED] amitriptyline (ELAVIL) 50 MG tablet Take 50 mg by mouth at bedtime.  . [DISCONTINUED] Cholecalciferol 5000 UNITS TABS Take 5,000 Units by mouth every Monday, Wednesday, and Friday.   . [DISCONTINUED] donepezil (ARICEPT) 5 MG tablet Take 5 mg by mouth at bedtime.  . [DISCONTINUED] HYDROcodone-acetaminophen (NORCO/VICODIN) 5-325 MG per tablet Take 1-2 tablets by mouth every 6 (six) hours as needed for pain.  . [DISCONTINUED] rosuvastatin (CRESTOR) 5 MG tablet Take 5 mg by mouth every other day.   No facility-administered encounter medications on file as of 12/24/2017.      Allergies:  Allergies  Allergen Reactions  . Gluten Meal Swelling    migraines    Family History: Family History  Problem Relation Age of Onset  . Hypertension Father   Father had history of alcohol abuse, committed suicide. Mother has schizophrenia. Daughters have migraines.  Social History: Social History   Tobacco Use  . Smoking status: Never Smoker  . Smokeless tobacco: Never Used  Substance Use Topics  . Alcohol use: No  . Drug use: No   Social History   Social History Narrative   Lives with wife in a one story home.  Has 2 daughters.  Retired Personal assistant.  Education: Set designer.      Review of Systems:  CONSTITUTIONAL: No fevers, chills, night sweats, or weight loss.   EYES: No visual changes or eye pain ENT: No hearing changes.  No history of nose bleeds.   RESPIRATORY: No cough, wheezing and shortness of breath.     CARDIOVASCULAR: Negative for chest pain, and palpitations.   GI: Negative for abdominal discomfort, blood in stools or black stools.  No recent change in bowel habits.   GU:  No history of incontinence.   MUSCLOSKELETAL: No history of joint pain or swelling.  No myalgias.   SKIN: Negative for lesions, rash, and itching.   HEMATOLOGY/ONCOLOGY: Negative for prolonged bleeding, bruising easily, and swollen nodes.  No history of cancer.   ENDOCRINE: Negative for cold or heat intolerance, polydipsia or goiter.   PSYCH:  + depression or anxiety symptoms.   NEURO: As Above.   Vital Signs:  BP 120/80   Pulse (!) 54   Ht 5' 10.5" (1.791 m)   Wt 196 lb 6 oz (89.1 kg)   SpO2 94%   BMI 27.78 kg/m    General Medical Exam:   General:  Well appearing, comfortable.   Eyes/ENT: see cranial  nerve examination.   Neck: No masses appreciated.  Full range of motion without tenderness.  No carotid bruits. Respiratory:  Clear to auscultation, good air entry bilaterally.   Cardiac:  Regular rate and rhythm, no murmur.   Extremities:  No deformities, edema, or skin discoloration.  Skin:  No rashes or lesions.  Neurological Exam: MENTAL STATUS including orientation to time, place, person, recent and remote memory, attention span and concentration, language, and fund of knowledge is normal.  Speech is not dysarthric.  Montreal Cognitive Assessment  12/24/2017  Visuospatial/ Executive (0/5) 5  Naming (0/3) 3  Attention: Read list of digits (0/2) 2  Attention: Read list of letters (0/1) 1  Attention: Serial 7 subtraction starting at 100 (0/3) 2  Language: Repeat phrase (0/2) 2  Language : Fluency (0/1) 0  Abstraction (0/2) 1  Delayed Recall (0/5) 2  Orientation (0/6) 6  Total 24  Adjusted Score (based on education) 24   CRANIAL NERVES: II:  No visual field defects.  Unremarkable fundi.   III-IV-VI: Pupils equal round and reactive to light.  Normal conjugate, extra-ocular eye movements in all  directions of gaze.  No nystagmus.  No ptosis.   V:  Normal facial sensation.    VII:  There is slight facial symmetry with the right side of the face, sitting slightly lower then the left.  Facial muscles and movements are intact.  VIII:  Normal hearing and vestibular function.   IX-X:  Normal palatal movement.   XI:  Normal shoulder shrug and head rotation.   XII:  Normal tongue strength and range of motion, no deviation or fasciculation.  MOTOR:  No atrophy, fasciculations or abnormal movements.  No pronator drift.  Tone is normal.    Right Upper Extremity:    Left Upper Extremity:    Deltoid  5/5   Deltoid  5/5   Biceps  5/5   Biceps  5/5   Triceps  5/5   Triceps  5/5   Wrist extensors  5/5   Wrist extensors  5/5   Wrist flexors  5/5   Wrist flexors  5/5   Finger extensors  5/5   Finger extensors  5/5   Finger flexors  5/5   Finger flexors  5/5   Dorsal interossei  5/5   Dorsal interossei  5/5   Abductor pollicis  5/5   Abductor pollicis  5/5   Tone (Ashworth scale)  0  Tone (Ashworth scale)  0   Right Lower Extremity:    Left Lower Extremity:    Hip flexors  5/5   Hip flexors  5/5   Hip extensors  5/5   Hip extensors  5/5   Knee flexors  5/5   Knee flexors  5/5   Knee extensors  5/5   Knee extensors  5/5   Dorsiflexors  5/5   Dorsiflexors  5/5   Plantarflexors  5/5   Plantarflexors  5/5   Toe extensors  5/5   Toe extensors  5/5   Toe flexors  5/5   Toe flexors  5/5   Tone (Ashworth scale)  0  Tone (Ashworth scale)  0   MSRs:  Right  Left brachioradialis 2+  brachioradialis 2+  biceps 2+  biceps 2+  triceps 2+  triceps 2+  patellar 2+  patellar 2+  ankle jerk 1+  ankle jerk 1+  Hoffman no  Hoffman no  plantar response down  plantar response down   SENSORY:  Normal and symmetric perception of light touch, pinprick, vibration, and proprioception.     COORDINATION/GAIT: Normal finger-to- nose-finger.  Intact  rapid alternating movements bilaterally.  Gait narrow based and stable. Tandem and stressed gait intact.    IMPRESSION: 1.  Chronic daily headaches, well-controlled on coenzyme Q10 200mg /d.  I offered alternative medications to see if we can try to reduce the frequency of his headaches, but he is comfortable with coenzyme Q10 alone.   Previously tried medications:  Abortive: diclofenac, OTC NSAIDs Preventive: amitriptyline 25mg  (effective, but caused imbalance and confusion) valproate (no benefit), magnesium (no benefit)  2.  Mild cognitive impairment (diagnosed 2017 on neuropsychology testing at [facility]), clinically stable.  He continues to do all IADLs and ADLs.  He is doing well on low dose Aricept 5mg  daily, which will be continued.  I encouraged him to stay physical active and engage in mentally stimulating activities.    Return to clinic in 1 year  Thank you for allowing me to participate in patient's care.  If I can answer any additional questions, I would be pleased to do so.    Sincerely,    Moniqua Engebretsen K. Posey Pronto, DO

## 2018-02-05 DIAGNOSIS — H401131 Primary open-angle glaucoma, bilateral, mild stage: Secondary | ICD-10-CM | POA: Diagnosis not present

## 2018-02-05 DIAGNOSIS — H35033 Hypertensive retinopathy, bilateral: Secondary | ICD-10-CM | POA: Diagnosis not present

## 2018-02-05 DIAGNOSIS — H21552 Recession of chamber angle, left eye: Secondary | ICD-10-CM | POA: Diagnosis not present

## 2018-02-05 DIAGNOSIS — Z961 Presence of intraocular lens: Secondary | ICD-10-CM | POA: Diagnosis not present

## 2018-06-27 ENCOUNTER — Encounter: Payer: Self-pay | Admitting: Neurology

## 2018-08-09 DIAGNOSIS — D225 Melanocytic nevi of trunk: Secondary | ICD-10-CM | POA: Diagnosis not present

## 2018-08-09 DIAGNOSIS — C44319 Basal cell carcinoma of skin of other parts of face: Secondary | ICD-10-CM | POA: Diagnosis not present

## 2018-08-09 DIAGNOSIS — D485 Neoplasm of uncertain behavior of skin: Secondary | ICD-10-CM | POA: Diagnosis not present

## 2018-08-09 DIAGNOSIS — D1801 Hemangioma of skin and subcutaneous tissue: Secondary | ICD-10-CM | POA: Diagnosis not present

## 2018-08-09 DIAGNOSIS — L814 Other melanin hyperpigmentation: Secondary | ICD-10-CM | POA: Diagnosis not present

## 2018-08-22 DIAGNOSIS — Z23 Encounter for immunization: Secondary | ICD-10-CM | POA: Diagnosis not present

## 2018-09-09 DIAGNOSIS — Z8619 Personal history of other infectious and parasitic diseases: Secondary | ICD-10-CM | POA: Diagnosis not present

## 2018-09-09 DIAGNOSIS — R739 Hyperglycemia, unspecified: Secondary | ICD-10-CM | POA: Diagnosis not present

## 2018-09-09 DIAGNOSIS — M810 Age-related osteoporosis without current pathological fracture: Secondary | ICD-10-CM | POA: Diagnosis not present

## 2018-09-09 DIAGNOSIS — M8580 Other specified disorders of bone density and structure, unspecified site: Secondary | ICD-10-CM | POA: Diagnosis not present

## 2018-09-09 DIAGNOSIS — E785 Hyperlipidemia, unspecified: Secondary | ICD-10-CM | POA: Diagnosis not present

## 2018-09-09 DIAGNOSIS — R413 Other amnesia: Secondary | ICD-10-CM | POA: Diagnosis not present

## 2018-09-09 DIAGNOSIS — Z0000 Encounter for general adult medical examination without abnormal findings: Secondary | ICD-10-CM | POA: Diagnosis not present

## 2018-09-09 DIAGNOSIS — Z125 Encounter for screening for malignant neoplasm of prostate: Secondary | ICD-10-CM | POA: Diagnosis not present

## 2018-10-02 DIAGNOSIS — C44319 Basal cell carcinoma of skin of other parts of face: Secondary | ICD-10-CM | POA: Diagnosis not present

## 2018-10-29 DIAGNOSIS — H00012 Hordeolum externum right lower eyelid: Secondary | ICD-10-CM | POA: Diagnosis not present

## 2018-12-09 ENCOUNTER — Other Ambulatory Visit: Payer: Self-pay | Admitting: *Deleted

## 2018-12-09 MED ORDER — DONEPEZIL HCL 5 MG PO TABS: 5.0000 mg | ORAL_TABLET | Freq: Every day | ORAL | 3 refills | Status: DC

## 2018-12-21 NOTE — Progress Notes (Signed)
Follow-up Visit   Date: 12/25/18   COHAN STIPES MRN: [id number] DOB: [birth year]   Interim History: Francisco Rodriguez is a 68 y.o. right-handed Caucasian male with glaucoma, hypertension, hyperlipidemia, and depression presenting for evaluation of migraines and mild cognitive impairment.  He was active in international [place] work and retired in 2015 due to neck pain and headaches.    History of present illness: He has long history of migraines since childhood and was taking OTC medications almost daily for many years.  After establishing care with [name], he advised to taper off OTC and limit to twice per week, which has also significantly improved the severity of his pain.  Currently, he takes takes conenzyme 200mg  daily for headaches.  He continues to have low-grade daily headaches and gets adequate relief with coenzyme 200mg /d which has reduced the intensity of his pain.  He takes imitrex 100mg  about twice per week for severe pain which helps.  Additionally, rest and sleep also helps. He has many food triggers and is vigilant about his diet.    He was diagnosed with mild cognitive impairment by [name], based on neurocognitive testing in 2017.  He was started on Aricept 5mg  daily and has been doing well.  Overall, wife and patient feel his memory is stable.  He sometimes forgets tasks and appointments, especially if he does not write it down.  He manages his own medications and drives without any safety issues.  He stays very active and walks for 1 hour daily.  He enjoys reading.  Mood and sleep is good.  UPDATE 12/25/2018:  He is here for 1 year visit and is doing well.  No new changes to his memory.  He feels that his headaches are improved and less frequent and less intense, occurring about twice per week.  He takes imitrex which helps his migraines.  He had increased his activity and walking 2-3 miles per day.  He is following a gluten-free diet.  He remains highly  independent, continues to manage his medications, finances, and driving.  He enjoys reading, grows orchids, and is active in [employer].    Medications:  Current Outpatient Medications on File Prior to Visit  Medication Sig Dispense Refill  . aspirin EC 81 MG tablet Take 81 mg by mouth daily.    . Calcium Carbonate Antacid (TUMS ULTRA 1000 PO) Take 1 tablet by mouth daily.    . Cholecalciferol (VITAMIN D3) 1000 units CAPS Take 2,000 Units by mouth.    . donepezil (ARICEPT) 5 MG tablet Take 1 tablet (5 mg total) by mouth at bedtime. 90 tablet 3  . Omega-3 Fatty Acids (FISH OIL) 1000 MG CPDR Take 2,000 mg by mouth daily.     . SUMAtriptan (IMITREX) 100 MG tablet Take 50 mg by mouth every 2 (two) hours as needed for migraine.    . timolol (BETIMOL) 0.5 % ophthalmic solution Place 1 drop into both eyes 2 (two) times daily.     . vitamin B-12 (CYANOCOBALAMIN) 500 MCG tablet Take 500 mcg by mouth daily.     No current facility-administered medications on file prior to visit.     Allergies:  Allergies  Allergen Reactions  . Gluten Meal Swelling    migraines    Review of Systems:  CONSTITUTIONAL: No fevers, chills, night sweats, or weight loss.  EYES: No visual changes or eye pain ENT: No hearing changes.  No history of nose bleeds.   RESPIRATORY: No cough,  wheezing and shortness of breath.   CARDIOVASCULAR: Negative for chest pain, and palpitations.   GI: Negative for abdominal discomfort, blood in stools or black stools.  No recent change in bowel habits.   GU:  No history of incontinence.   MUSCLOSKELETAL: No history of joint pain or swelling.  No myalgias.   SKIN: Negative for lesions, rash, and itching.   ENDOCRINE: Negative for cold or heat intolerance, polydipsia or goiter.   PSYCH:  No depression or anxiety symptoms.   NEURO: As Above.   Vital Signs:  BP 110/78   Pulse (!) 56   Ht 5' 10.5" (1.791 m)   Wt 193 lb 6 oz (87.7 kg)   SpO2 97%   BMI 27.35 kg/m    General  Medical Exam:   General:  Well appearing, comfortable  Eyes/ENT: see cranial nerve examination.   Neck:  No carotid bruits. Respiratory:  Clear to auscultation, good air entry bilaterally.   Cardiac:  Regular rate and rhythm, no murmur.   Ext:  No edema   Neurological Exam: MENTAL STATUS including orientation to time, place, person, recent and remote memory, attention span and concentration, language, and fund of knowledge is normal.  Speech is not dysarthric.  Montreal Cognitive Assessment  12/25/2018 12/24/2017  Visuospatial/ Executive (0/5) 2 5  Naming (0/3) 3 3  Attention: Read list of digits (0/2) 2 2  Attention: Read list of letters (0/1) 1 1  Attention: Serial 7 subtraction starting at 100 (0/3) 1 2  Language: Repeat phrase (0/2) 1 2  Language : Fluency (0/1) 0 0  Abstraction (0/2) 2 1  Delayed Recall (0/5) 3 2  Orientation (0/6) 5 6  Total 20 24  Adjusted Score (based on education) 20 24   CRANIAL NERVES:   Pupils equal round and reactive to light.  Normal conjugate, extra-ocular eye movements in all directions of gaze.  No ptosis.  Face is symmetric. Palate elevates symmetrically.  Tongue is midline.  MOTOR:  Motor strength is 5/5 in all extremities.  No atrophy, fasciculations or abnormal movements.  No pronator drift.  Tone is normal.    MSRs:  Reflexes are 2+/4 throughout .  SENSORY:  Intact to vibration throughout.  COORDINATION/GAIT:  Normal finger-to- nose-finger.  Intact rapid alternating movements bilaterally.  Gait narrow based and stable.   Data: MRI brain wwo contrast 03/29/2015:  Normal pre- and postcontrast MRI of the brain.  NCS/EMG of the legs 07/21/2015:  There is electrodiagnostic evidence for a mild, chronic, axonal sensory motor polyneuropathy.  Formal neuropsychological testing January 2017:  Mild cognitive impairment   IMPRESSION/PLAN: 1.  Episodic migraines.  He is doing well and reports improved frequency and intensity of headaches.  Continue  imitrex 50-100mg  as needed for severe migraine.  2.  Chronic daily headaches have improved on coenzyme Q10 200mg /d.  Previously, he has tried diclofenac, amitriptyline 25mg  (effective, but caused imbalance and confusion) valproate (no benefit), magnesium (no benefit)  3.  Mild cognitive impairment (diagnosed 2017 on neuropsychology testing at [facility]), clinically stable. He continues to do all IADLs and ADLs.  He is doing well on low dose Aricept 5mg  daily, which will be continued.  I praised him for staying active and walking 2-3 miles/d and encouraged him to continue to engage in mentally stimulating activities.    Return to clinic in 1 year.   Greater than 50% of this 25 minute visit was spent in counseling, explanation of diagnosis, planning of further management, and coordination of  care.  Thank you for allowing me to participate in patient's care.  If I can answer any additional questions, I would be pleased to do so.    Sincerely,    Donika K. Posey Pronto, DO

## 2018-12-25 ENCOUNTER — Encounter: Payer: Self-pay | Admitting: Neurology

## 2018-12-25 ENCOUNTER — Ambulatory Visit (INDEPENDENT_AMBULATORY_CARE_PROVIDER_SITE_OTHER): Payer: Medicare Other | Admitting: Neurology

## 2018-12-25 VITALS — BP 110/78 | HR 56 | Ht 70.5 in | Wt 193.4 lb

## 2018-12-25 DIAGNOSIS — G3184 Mild cognitive impairment, so stated: Secondary | ICD-10-CM | POA: Diagnosis not present

## 2018-12-25 DIAGNOSIS — R51 Headache: Secondary | ICD-10-CM

## 2018-12-25 DIAGNOSIS — R519 Headache, unspecified: Secondary | ICD-10-CM

## 2018-12-25 NOTE — Patient Instructions (Signed)
Keep up the great work, continue to stay active  You can try cutting your imitrex down to 50mg  and see if that still controls your migraines.  Return to clinic in 1 year

## 2018-12-30 DIAGNOSIS — H00012 Hordeolum externum right lower eyelid: Secondary | ICD-10-CM | POA: Diagnosis not present

## 2019-01-03 ENCOUNTER — Other Ambulatory Visit: Payer: Self-pay | Admitting: Surgery

## 2019-01-03 DIAGNOSIS — H00012 Hordeolum externum right lower eyelid: Secondary | ICD-10-CM | POA: Diagnosis not present

## 2019-01-10 DIAGNOSIS — H40051 Ocular hypertension, right eye: Secondary | ICD-10-CM | POA: Diagnosis not present

## 2019-01-10 DIAGNOSIS — H00012 Hordeolum externum right lower eyelid: Secondary | ICD-10-CM | POA: Diagnosis not present

## 2019-03-20 DIAGNOSIS — Z03818 Encounter for observation for suspected exposure to other biological agents ruled out: Secondary | ICD-10-CM | POA: Diagnosis not present

## 2019-03-21 DIAGNOSIS — D23122 Other benign neoplasm of skin of left lower eyelid, including canthus: Secondary | ICD-10-CM | POA: Diagnosis not present

## 2019-03-21 DIAGNOSIS — H0012 Chalazion right lower eyelid: Secondary | ICD-10-CM | POA: Diagnosis not present

## 2019-04-21 DIAGNOSIS — H0012 Chalazion right lower eyelid: Secondary | ICD-10-CM | POA: Diagnosis not present

## 2019-05-30 ENCOUNTER — Other Ambulatory Visit: Payer: Self-pay

## 2019-05-30 DIAGNOSIS — R52 Pain, unspecified: Secondary | ICD-10-CM | POA: Diagnosis not present

## 2019-05-30 DIAGNOSIS — R509 Fever, unspecified: Secondary | ICD-10-CM | POA: Diagnosis not present

## 2019-05-30 DIAGNOSIS — R6889 Other general symptoms and signs: Secondary | ICD-10-CM | POA: Diagnosis not present

## 2019-05-30 DIAGNOSIS — Z20822 Contact with and (suspected) exposure to covid-19: Secondary | ICD-10-CM

## 2019-06-01 LAB — NOVEL CORONAVIRUS, NAA: SARS-CoV-2, NAA: NOT DETECTED

## 2019-06-06 ENCOUNTER — Other Ambulatory Visit: Payer: Self-pay

## 2019-06-06 DIAGNOSIS — Z20822 Contact with and (suspected) exposure to covid-19: Secondary | ICD-10-CM

## 2019-06-07 LAB — NOVEL CORONAVIRUS, NAA: SARS-CoV-2, NAA: NOT DETECTED

## 2019-06-09 DIAGNOSIS — R509 Fever, unspecified: Secondary | ICD-10-CM | POA: Diagnosis not present

## 2019-06-09 DIAGNOSIS — R51 Headache: Secondary | ICD-10-CM | POA: Diagnosis not present

## 2019-06-09 DIAGNOSIS — R197 Diarrhea, unspecified: Secondary | ICD-10-CM | POA: Diagnosis not present

## 2019-06-09 DIAGNOSIS — M791 Myalgia, unspecified site: Secondary | ICD-10-CM | POA: Diagnosis not present

## 2019-06-23 DIAGNOSIS — Z961 Presence of intraocular lens: Secondary | ICD-10-CM | POA: Diagnosis not present

## 2019-06-23 DIAGNOSIS — H401131 Primary open-angle glaucoma, bilateral, mild stage: Secondary | ICD-10-CM | POA: Diagnosis not present

## 2019-06-23 DIAGNOSIS — H35033 Hypertensive retinopathy, bilateral: Secondary | ICD-10-CM | POA: Diagnosis not present

## 2019-06-23 DIAGNOSIS — H0102B Squamous blepharitis left eye, upper and lower eyelids: Secondary | ICD-10-CM | POA: Diagnosis not present

## 2019-07-03 DIAGNOSIS — Z23 Encounter for immunization: Secondary | ICD-10-CM | POA: Diagnosis not present

## 2019-07-04 ENCOUNTER — Other Ambulatory Visit: Payer: Self-pay

## 2019-07-04 MED ORDER — SUMATRIPTAN SUCCINATE 100 MG PO TABS: 50.0000 mg | ORAL_TABLET | ORAL | 11 refills | Status: DC | PRN

## 2019-07-08 ENCOUNTER — Telehealth: Payer: Self-pay

## 2019-07-08 NOTE — Telephone Encounter (Signed)
I spoke with patient he is going to call his pharmacy.

## 2019-07-09 ENCOUNTER — Other Ambulatory Visit: Payer: Self-pay

## 2019-07-09 ENCOUNTER — Telehealth: Payer: Self-pay | Admitting: Neurology

## 2019-07-09 NOTE — Telephone Encounter (Signed)
Caller left msg with after hours about calling from [facility] and is needing a call back for a 90 day supply med. Thanks!

## 2019-07-09 NOTE — Telephone Encounter (Signed)
Pt called

## 2019-07-11 ENCOUNTER — Other Ambulatory Visit: Payer: Self-pay

## 2019-07-11 MED ORDER — SUMATRIPTAN SUCCINATE 100 MG PO TABS: 50.0000 mg | ORAL_TABLET | ORAL | 3 refills | Status: DC | PRN

## 2019-07-11 NOTE — Addendum Note (Signed)
Addended by: Alda Berthold on: 07/11/2019 12:44 PM   Modules accepted: Orders

## 2019-07-16 MED ORDER — SUMATRIPTAN SUCCINATE 100 MG PO TABS: 100.0000 mg | ORAL_TABLET | ORAL | 3 refills | Status: DC | PRN

## 2019-07-16 NOTE — Telephone Encounter (Signed)
Dr Patel patient.  

## 2019-07-16 NOTE — Telephone Encounter (Signed)
Marcello Moores from [facility] called and left a message requesting dosing clarification for sumatriptan. He said this medication is not intended to be split, they must have a verbal documentation on file about this.   Caller recommended the return caller leave a first and last name if a message is left, per [place].

## 2019-07-16 NOTE — Telephone Encounter (Signed)
Prescription corrected for imitrex 100mg  and resent.

## 2019-07-16 NOTE — Telephone Encounter (Signed)
Please clarify dosage tomorrow on 07/17/2019 thanks

## 2019-08-11 ENCOUNTER — Other Ambulatory Visit: Payer: Self-pay

## 2019-08-11 DIAGNOSIS — Z20828 Contact with and (suspected) exposure to other viral communicable diseases: Secondary | ICD-10-CM | POA: Diagnosis not present

## 2019-08-11 DIAGNOSIS — Z20822 Contact with and (suspected) exposure to covid-19: Secondary | ICD-10-CM

## 2019-08-12 LAB — NOVEL CORONAVIRUS, NAA: SARS-CoV-2, NAA: NOT DETECTED

## 2019-08-22 DIAGNOSIS — L821 Other seborrheic keratosis: Secondary | ICD-10-CM | POA: Diagnosis not present

## 2019-08-22 DIAGNOSIS — L739 Follicular disorder, unspecified: Secondary | ICD-10-CM | POA: Diagnosis not present

## 2019-08-22 DIAGNOSIS — Z85828 Personal history of other malignant neoplasm of skin: Secondary | ICD-10-CM | POA: Diagnosis not present

## 2019-08-22 DIAGNOSIS — L814 Other melanin hyperpigmentation: Secondary | ICD-10-CM | POA: Diagnosis not present

## 2019-08-22 DIAGNOSIS — D229 Melanocytic nevi, unspecified: Secondary | ICD-10-CM | POA: Diagnosis not present

## 2019-08-22 DIAGNOSIS — L84 Corns and callosities: Secondary | ICD-10-CM | POA: Diagnosis not present

## 2019-09-03 ENCOUNTER — Other Ambulatory Visit: Payer: Self-pay

## 2019-09-03 DIAGNOSIS — Z20822 Contact with and (suspected) exposure to covid-19: Secondary | ICD-10-CM

## 2019-09-03 DIAGNOSIS — Z20828 Contact with and (suspected) exposure to other viral communicable diseases: Secondary | ICD-10-CM | POA: Diagnosis not present

## 2019-09-05 LAB — NOVEL CORONAVIRUS, NAA: SARS-CoV-2, NAA: NOT DETECTED

## 2019-09-11 DIAGNOSIS — M8580 Other specified disorders of bone density and structure, unspecified site: Secondary | ICD-10-CM | POA: Diagnosis not present

## 2019-09-11 DIAGNOSIS — E785 Hyperlipidemia, unspecified: Secondary | ICD-10-CM | POA: Diagnosis not present

## 2019-09-11 DIAGNOSIS — R61 Generalized hyperhidrosis: Secondary | ICD-10-CM | POA: Diagnosis not present

## 2019-09-11 DIAGNOSIS — Z0000 Encounter for general adult medical examination without abnormal findings: Secondary | ICD-10-CM | POA: Diagnosis not present

## 2019-09-11 DIAGNOSIS — R413 Other amnesia: Secondary | ICD-10-CM | POA: Diagnosis not present

## 2019-09-11 DIAGNOSIS — Z125 Encounter for screening for malignant neoplasm of prostate: Secondary | ICD-10-CM | POA: Diagnosis not present

## 2019-09-11 DIAGNOSIS — E559 Vitamin D deficiency, unspecified: Secondary | ICD-10-CM | POA: Diagnosis not present

## 2019-09-11 DIAGNOSIS — G43909 Migraine, unspecified, not intractable, without status migrainosus: Secondary | ICD-10-CM | POA: Diagnosis not present

## 2019-09-15 ENCOUNTER — Other Ambulatory Visit: Payer: Self-pay | Admitting: Family Medicine

## 2019-09-15 DIAGNOSIS — M8580 Other specified disorders of bone density and structure, unspecified site: Secondary | ICD-10-CM

## 2019-09-15 DIAGNOSIS — E559 Vitamin D deficiency, unspecified: Secondary | ICD-10-CM

## 2019-09-25 DIAGNOSIS — R61 Generalized hyperhidrosis: Secondary | ICD-10-CM | POA: Diagnosis not present

## 2019-09-29 DIAGNOSIS — R509 Fever, unspecified: Secondary | ICD-10-CM | POA: Diagnosis not present

## 2019-09-30 DIAGNOSIS — R61 Generalized hyperhidrosis: Secondary | ICD-10-CM | POA: Diagnosis not present

## 2019-09-30 DIAGNOSIS — R509 Fever, unspecified: Secondary | ICD-10-CM | POA: Diagnosis not present

## 2019-10-01 DIAGNOSIS — R509 Fever, unspecified: Secondary | ICD-10-CM | POA: Diagnosis not present

## 2019-11-05 DIAGNOSIS — N390 Urinary tract infection, site not specified: Secondary | ICD-10-CM | POA: Diagnosis not present

## 2019-11-18 ENCOUNTER — Other Ambulatory Visit: Payer: Self-pay | Admitting: Family Medicine

## 2019-11-18 DIAGNOSIS — N390 Urinary tract infection, site not specified: Secondary | ICD-10-CM

## 2019-11-27 ENCOUNTER — Ambulatory Visit: Payer: Medicare Other

## 2019-11-27 ENCOUNTER — Ambulatory Visit
Admission: RE | Admit: 2019-11-27 | Discharge: 2019-11-27 | Disposition: A | Discharge status: Home / Self Care | Payer: Medicare Other | Source: Ambulatory Visit | Attending: Family Medicine | Admitting: Family Medicine

## 2019-11-27 ENCOUNTER — Other Ambulatory Visit: Payer: Self-pay

## 2019-11-27 DIAGNOSIS — N390 Urinary tract infection, site not specified: Secondary | ICD-10-CM

## 2019-11-27 DIAGNOSIS — N1330 Unspecified hydronephrosis: Secondary | ICD-10-CM | POA: Diagnosis not present

## 2019-11-27 MED ORDER — IOPAMIDOL (ISOVUE-300) INJECTION 61%
100.0000 mL | Freq: Once | INTRAVENOUS | Status: AC | PRN
  Administered 2019-11-27: 100 mL via INTRAVENOUS

## 2019-12-02 DIAGNOSIS — R3914 Feeling of incomplete bladder emptying: Secondary | ICD-10-CM | POA: Diagnosis not present

## 2019-12-02 DIAGNOSIS — R972 Elevated prostate specific antigen [PSA]: Secondary | ICD-10-CM | POA: Diagnosis not present

## 2019-12-02 DIAGNOSIS — R9341 Abnormal radiologic findings on diagnostic imaging of renal pelvis, ureter, or bladder: Secondary | ICD-10-CM | POA: Diagnosis not present

## 2019-12-02 DIAGNOSIS — N281 Cyst of kidney, acquired: Secondary | ICD-10-CM | POA: Diagnosis not present

## 2019-12-15 ENCOUNTER — Other Ambulatory Visit: Payer: Medicare Other

## 2019-12-24 ENCOUNTER — Encounter: Payer: Self-pay | Admitting: Neurology

## 2019-12-26 ENCOUNTER — Telehealth (INDEPENDENT_AMBULATORY_CARE_PROVIDER_SITE_OTHER): Payer: Medicare Other | Admitting: Neurology

## 2019-12-26 ENCOUNTER — Other Ambulatory Visit: Payer: Self-pay

## 2019-12-26 VITALS — Ht 70.5 in | Wt 185.0 lb

## 2019-12-26 DIAGNOSIS — R519 Headache, unspecified: Secondary | ICD-10-CM | POA: Diagnosis not present

## 2019-12-26 DIAGNOSIS — G43009 Migraine without aura, not intractable, without status migrainosus: Secondary | ICD-10-CM

## 2019-12-26 DIAGNOSIS — G3184 Mild cognitive impairment, so stated: Secondary | ICD-10-CM

## 2019-12-26 MED ORDER — RIZATRIPTAN BENZOATE 10 MG PO TABS: 10.0000 mg | ORAL_TABLET | ORAL | 3 refills | Status: DC | PRN

## 2019-12-26 MED ORDER — DONEPEZIL HCL 5 MG PO TABS: 5.0000 mg | ORAL_TABLET | Freq: Every day | ORAL | 3 refills | Status: DC

## 2019-12-26 NOTE — Progress Notes (Signed)
   Virtual Visit via Video Note The purpose of this virtual visit is to provide medical care while limiting exposure to the novel coronavirus.    Consent was obtained for video visit:  Yes.   Answered questions that patient had about telehealth interaction:  Yes.   I discussed the limitations, risks, security and privacy concerns of performing an evaluation and management service by telemedicine. I also discussed with the patient that there may be a patient responsible charge related to this service. The patient expressed understanding and agreed to proceed.  Pt location: Home Physician Location: office Name of referring provider:  Vernie Shanks, MD I connected with Francisco Rodriguez at patients initiation/request on 12/26/2019 at 10:50 AM EST by video enabled telemedicine application and verified that I am speaking with the correct person using two identifiers. Pt MRN:  [id number] Pt DOB:  1951-10-08 Video Participants:  Francisco Rodriguez   History of Present Illness: This is a 69 y.o. male returning for follow-up of migraines and cognitive impairment.  He continues to get migraines 3-4 times per month and manages it with rest, because imitrex is ineffective. He gets dull headaches almost daily, but is able to function through this.    Overall memory is stable.  Sometimes, wife notices that he asks the same question several times and other times he may not always answer the question asked. He remains highly independent and continues to manage finances, medications, and drives.  He is active and walks 2 miles daily.   He was having frequent UTIs found to be secondary to BPH and now is on flomax.   Observations/Objective:   Vitals:   12/24/19 1414  Weight: 185 lb (83.9 kg)  Height: 5' 10.5" (1.791 m)   Patient is awake, alert, and appears comfortable. Easily engages in conversation, follows commands Extraocular muscles are intact. No ptosis.  Face is symmetric.  Speech is not dysarthric.   Antigravity in all extremities.  No pronator drift. Gait appears normal.   Assessment and Plan:  1.  Episodic migraines, stable occurring 3-4 times per months.   - Start maxalt 10mg  for severe migraine  - D/C imitrex due to ineffectiveness  2.  Chronic daily headaches, stable on coenzyme Q10  - Previously tried:  Diclofenac, amitriptyline, valproate, magnesium  3.  Mild cognitive impairment (diagnosed 2017 based on neuropsychological testing at [facility]), clinically stable and able to perform ADLs and IADLs  - Continue low dose aricept 5mg  daily  - Encouraged him to continue to stay active and engage in mentally stimulating activities as he is already doing   Follow Up Instructions:   I discussed the assessment and treatment plan with the patient. The patient was provided an opportunity to ask questions and all were answered. The patient agreed with the plan and demonstrated an understanding of the instructions.   The patient was advised to call back or seek an in-person evaluation if the symptoms worsen or if the condition fails to improve as anticipated.  Follow-up in 1 year    Alda Berthold, DO

## 2019-12-31 DIAGNOSIS — N401 Enlarged prostate with lower urinary tract symptoms: Secondary | ICD-10-CM | POA: Diagnosis not present

## 2019-12-31 DIAGNOSIS — R3914 Feeling of incomplete bladder emptying: Secondary | ICD-10-CM | POA: Diagnosis not present

## 2019-12-31 DIAGNOSIS — Z87440 Personal history of urinary (tract) infections: Secondary | ICD-10-CM | POA: Diagnosis not present

## 2019-12-31 DIAGNOSIS — R972 Elevated prostate specific antigen [PSA]: Secondary | ICD-10-CM | POA: Diagnosis not present

## 2020-01-20 DIAGNOSIS — Z87440 Personal history of urinary (tract) infections: Secondary | ICD-10-CM | POA: Diagnosis not present

## 2020-01-20 DIAGNOSIS — R3914 Feeling of incomplete bladder emptying: Secondary | ICD-10-CM | POA: Diagnosis not present

## 2020-01-29 ENCOUNTER — Encounter: Payer: Self-pay | Admitting: Internal Medicine

## 2020-01-29 ENCOUNTER — Other Ambulatory Visit: Payer: Self-pay

## 2020-01-29 ENCOUNTER — Ambulatory Visit (INDEPENDENT_AMBULATORY_CARE_PROVIDER_SITE_OTHER): Payer: Medicare Other | Admitting: Internal Medicine

## 2020-01-29 DIAGNOSIS — R509 Fever, unspecified: Secondary | ICD-10-CM

## 2020-01-29 NOTE — Progress Notes (Signed)
[facility]      Reason for Consult: FUO    Referring Physician:     Patient ID: Francisco Rodriguez, male    DOB: [birth year], 69 y.o.   MRN: [id number]  HPI:   He has had intermittent fevers between 100 and 103 since about last Spring.  These have not been associated with any particular symptoms and typically is one fever, followed by fatigue and then resolution with no issues.  He has not had any associated weight loss, lymphadenopathy, rash, myalgias or arthralgias.  He does have intermittent chronic diarrhea he has attributed to gluten intolerance.  No known diagnosis of Celiac disease.  He is on the call with his wife who is a retired PA.  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  Evaluation by Dr. Jacelyn Grip has included a thorough labs and radiographic evaluation including CMP, CBC, SPEP, inflammatory markers, tick related infections, CT abdomen.  He has seen urology based on CT results and cytoscopy was unrevealing.  He has had repeated UAs that have been normal with no WBCs.  He has had urine cultures sent and has persistent E coli in the cultures.  He recently had issues with urinary retention, now resolved with medical therapy.    Past Medical History:  Diagnosis Date  . Bacterial UTI   . Celiac disease   . Complication of anesthesia 1994?   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 Depression    from gluten allergy  . Gallstones   . GERD (gastroesophageal reflux disease)   . Glaucoma   . Headache(784.0)    migraines  . Herniated disc    lumbar  . Hyperlipidemia   . Hypertension    history of left ventricular hypertrophy with hypertension meds/ since  gluten allergy lost weight and off meds- per wife hypertrophy resolved  . Osteopenia   . Peripheral vascular disease (HCC)    hx right DVT post op  . Urethral stenosis     Prior to Admission  medications   Medication Sig Start Date End Date Taking? Authorizing Provider  aspirin EC 81 MG tablet Take 81 mg by mouth daily.   Yes [provider]  b complex vitamins tablet Take 1 tablet by mouth daily.   Yes [provider]  Calcium Carbonate Antacid (TUMS ULTRA 1000 PO) Take 1 tablet by mouth daily.   Yes [provider]  Cholecalciferol (VITAMIN D3) 1000 units CAPS Take 2,000 Units by mouth.   Yes [provider]  donepezil (ARICEPT) 5 MG tablet Take 1 tablet (5 mg total) by mouth at bedtime. 12/26/19  Yes Patel, Donika K, DO  rizatriptan (MAXALT) 10 MG tablet Take 1 tablet (10 mg total) by mouth as needed for migraine. May repeat in 2 hours if needed 12/26/19  Yes Patel, Donika K, DO  sulfamethoxazole-trimethoprim (BACTRIM DS) 800-160 MG tablet Take 1 tablet by mouth 2 (two) times daily.   Yes [provider]  SUMAtriptan (IMITREX) 100 MG tablet Take 1 tablet (100 mg total) by mouth every 2 (two) hours as needed for migraine. Limit to twice per week. 07/16/19  Yes Patel, Donika K, DO  tamsulosin (FLOMAX) 0.4 MG CAPS capsule Take 0.4 mg by mouth daily.   Yes [provider]  timolol (BETIMOL) 0.5 % ophthalmic solution Place 1 drop into both eyes 2 (two) times  daily.    Yes [provider]  timolol (TIMOPTIC) 0.5 % ophthalmic solution 1 drop 2 times daily.   Yes [provider]  Omega-3 Fatty Acids (FISH OIL) 1000 MG CPDR Take 2,000 mg by mouth daily.     [provider]    Allergies  Allergen Reactions  . Gluten Meal Swelling    migraines    Social History   Tobacco Use  . Smoking status: Never Smoker  . Smokeless tobacco: Never Used  Substance Use Topics  . Alcohol use: No  . Drug use: No    Family History  Problem Relation Age of Onset  . Hypertension Father     Review of Systems  Constitutional: positive for fevers or negative for sweats, fatigue, malaise, anorexia and weight  loss Respiratory: negative for cough, sputum or hemoptysis Gastrointestinal: negative for nausea, vomiting and change in bowel habits Genitourinary: negative for frequency, dysuria and urinary incontinence Integument/breast: negative for rash and skin lesion(s) Hematologic/lymphatic: negative for lymphadenopathy Musculoskeletal: negative for myalgias, arthralgias, stiff joints and muscle weakness Neurological: negative for headaches, dizziness and vertigo All other systems reviewed and are negative    Constitutional: no sob on phone Vitals:   01/29/20 0842  Temp: (!) 96.6 F (35.9 C)     Labs: Lab Results  Component Value Date   WBC 5.9 05/29/2013   HGB 15.1 05/29/2013   HCT 46.7 05/29/2013   MCV 90.5 05/29/2013   PLT 238 05/29/2013    Lab Results  Component Value Date   CREATININE 0.93 05/29/2013   BUN 12 05/29/2013   NA 141 05/29/2013   K 4.5 05/29/2013   CL 103 05/29/2013   CO2 34 (H) 05/29/2013    Lab Results  Component Value Date   ALT 19 05/29/2013   AST 16 05/29/2013   ALKPHOS 78 05/29/2013   BILITOT 0.3 05/29/2013   INR 0.98 03/30/2013     Assessment: FUO.  He has had an extensive evaluation with no etiology.  No associated symptoms to guide diagnosis.  The only consideration now is with the chronic diarrhea would be strongyloides.  He previously has had schistosomiasis testing by their report and was negative.  He has had some leukopenia of unknown etiology but has been present before.  I discussed that likely without associated symptoms, the occurrence of the fever will diminish with time and can continue supportive care otherwise.  I would like to hear of any new symptoms associated with fever in the future and will direct an evaluation if that is the case.   He currently is on antibiotics for 'UTI' but is asymptomatic and is c/w asymptomatic bacteruria so no treatment indicated and I discussed stopping his current antibiotic.    Plan: 1) strongyloides Ab,  treatment if positive Follow up as needed

## 2020-02-03 ENCOUNTER — Other Ambulatory Visit: Payer: Self-pay

## 2020-02-03 ENCOUNTER — Other Ambulatory Visit: Payer: Medicare Other

## 2020-02-03 DIAGNOSIS — R509 Fever, unspecified: Secondary | ICD-10-CM | POA: Diagnosis not present

## 2020-02-07 LAB — STRONGYLOIDES ANTIBODY: Strongyloides IgG Antibody, ELISA: NEGATIVE

## 2020-02-09 ENCOUNTER — Telehealth: Payer: Self-pay

## 2020-02-09 NOTE — Telephone Encounter (Signed)
Relayed lab results to patient and to follow up with PCP for further questions/concerns.  Toddrick Sanna Lorita Officer, RN

## 2020-02-09 NOTE — Telephone Encounter (Signed)
-----   Message from Thayer Headings, MD sent at 02/09/2020  2:11 PM EDT ----- Let him know his Strongyloides Ab is negative so no indication for treatment.   thanks

## 2020-02-24 ENCOUNTER — Other Ambulatory Visit: Payer: Self-pay

## 2020-02-24 ENCOUNTER — Ambulatory Visit
Admission: RE | Admit: 2020-02-24 | Discharge: 2020-02-24 | Disposition: A | Discharge status: Home / Self Care | Payer: Medicare Other | Source: Ambulatory Visit | Attending: Family Medicine | Admitting: Family Medicine

## 2020-02-24 DIAGNOSIS — M8580 Other specified disorders of bone density and structure, unspecified site: Secondary | ICD-10-CM

## 2020-02-24 DIAGNOSIS — E559 Vitamin D deficiency, unspecified: Secondary | ICD-10-CM

## 2020-02-24 DIAGNOSIS — Z0389 Encounter for observation for other suspected diseases and conditions ruled out: Secondary | ICD-10-CM | POA: Diagnosis not present

## 2020-06-20 DIAGNOSIS — Z20822 Contact with and (suspected) exposure to covid-19: Secondary | ICD-10-CM | POA: Diagnosis not present

## 2020-07-06 DIAGNOSIS — Z20822 Contact with and (suspected) exposure to covid-19: Secondary | ICD-10-CM | POA: Diagnosis not present

## 2020-07-20 DIAGNOSIS — Z961 Presence of intraocular lens: Secondary | ICD-10-CM | POA: Diagnosis not present

## 2020-07-20 DIAGNOSIS — H35033 Hypertensive retinopathy, bilateral: Secondary | ICD-10-CM | POA: Diagnosis not present

## 2020-07-20 DIAGNOSIS — H401131 Primary open-angle glaucoma, bilateral, mild stage: Secondary | ICD-10-CM | POA: Diagnosis not present

## 2020-07-20 DIAGNOSIS — H0102A Squamous blepharitis right eye, upper and lower eyelids: Secondary | ICD-10-CM | POA: Diagnosis not present

## 2020-07-26 DIAGNOSIS — R351 Nocturia: Secondary | ICD-10-CM | POA: Diagnosis not present

## 2020-07-26 DIAGNOSIS — N401 Enlarged prostate with lower urinary tract symptoms: Secondary | ICD-10-CM | POA: Diagnosis not present

## 2020-07-27 DIAGNOSIS — Z23 Encounter for immunization: Secondary | ICD-10-CM | POA: Diagnosis not present

## 2020-08-12 DIAGNOSIS — Z23 Encounter for immunization: Secondary | ICD-10-CM | POA: Diagnosis not present

## 2020-08-31 ENCOUNTER — Telehealth: Payer: Self-pay

## 2020-08-31 MED ORDER — RIZATRIPTAN BENZOATE 10 MG PO TABS: 10.0000 mg | ORAL_TABLET | ORAL | 2 refills | Status: DC | PRN

## 2020-08-31 NOTE — Telephone Encounter (Signed)
Patient is requesting an RX for Rizatriptan to be prescribed. Patient states he didn't want it previously due to cost, however he can now get a coupon that helps with the cost. Patient is requesting a new script.

## 2020-08-31 NOTE — Telephone Encounter (Signed)
Rizatriptan (Maxalt) 10mg , looks like it was sent in 12/2019?

## 2020-08-31 NOTE — Telephone Encounter (Signed)
Rizatriptan 10mg  has been sent to [facility]. Patient Mychart message sent to patient to inform.

## 2020-08-31 NOTE — Telephone Encounter (Signed)
Ok to refill, pls confirm pharmacy and send refills. Thanks!

## 2020-08-31 NOTE — Addendum Note (Signed)
Addended by: Armen Pickup A on: 08/31/2020 01:04 PM   Modules accepted: Orders

## 2020-09-01 DIAGNOSIS — D2262 Melanocytic nevi of left upper limb, including shoulder: Secondary | ICD-10-CM | POA: Diagnosis not present

## 2020-09-01 DIAGNOSIS — D692 Other nonthrombocytopenic purpura: Secondary | ICD-10-CM | POA: Diagnosis not present

## 2020-09-01 DIAGNOSIS — L905 Scar conditions and fibrosis of skin: Secondary | ICD-10-CM | POA: Diagnosis not present

## 2020-09-01 DIAGNOSIS — Z85828 Personal history of other malignant neoplasm of skin: Secondary | ICD-10-CM | POA: Diagnosis not present

## 2020-09-01 DIAGNOSIS — D225 Melanocytic nevi of trunk: Secondary | ICD-10-CM | POA: Diagnosis not present

## 2020-09-01 DIAGNOSIS — D485 Neoplasm of uncertain behavior of skin: Secondary | ICD-10-CM | POA: Diagnosis not present

## 2020-09-10 DIAGNOSIS — D485 Neoplasm of uncertain behavior of skin: Secondary | ICD-10-CM | POA: Diagnosis not present

## 2020-09-10 DIAGNOSIS — L905 Scar conditions and fibrosis of skin: Secondary | ICD-10-CM | POA: Diagnosis not present

## 2020-09-24 ENCOUNTER — Other Ambulatory Visit: Payer: Self-pay | Admitting: Neurology

## 2020-09-24 MED ORDER — DONEPEZIL HCL 5 MG PO TABS: 5.0000 mg | ORAL_TABLET | Freq: Every day | ORAL | 0 refills | Status: DC

## 2020-09-27 DIAGNOSIS — M7989 Other specified soft tissue disorders: Secondary | ICD-10-CM | POA: Diagnosis not present

## 2020-09-27 DIAGNOSIS — N1330 Unspecified hydronephrosis: Secondary | ICD-10-CM | POA: Diagnosis not present

## 2020-09-27 DIAGNOSIS — M8580 Other specified disorders of bone density and structure, unspecified site: Secondary | ICD-10-CM | POA: Diagnosis not present

## 2020-09-27 DIAGNOSIS — G43909 Migraine, unspecified, not intractable, without status migrainosus: Secondary | ICD-10-CM | POA: Diagnosis not present

## 2020-09-27 DIAGNOSIS — Z8619 Personal history of other infectious and parasitic diseases: Secondary | ICD-10-CM | POA: Diagnosis not present

## 2020-09-27 DIAGNOSIS — M8589 Other specified disorders of bone density and structure, multiple sites: Secondary | ICD-10-CM | POA: Diagnosis not present

## 2020-09-27 DIAGNOSIS — Z0000 Encounter for general adult medical examination without abnormal findings: Secondary | ICD-10-CM | POA: Diagnosis not present

## 2020-09-27 DIAGNOSIS — E785 Hyperlipidemia, unspecified: Secondary | ICD-10-CM | POA: Diagnosis not present

## 2020-09-27 DIAGNOSIS — E559 Vitamin D deficiency, unspecified: Secondary | ICD-10-CM | POA: Diagnosis not present

## 2020-09-27 DIAGNOSIS — R600 Localized edema: Secondary | ICD-10-CM | POA: Diagnosis not present

## 2020-09-27 DIAGNOSIS — I700 Atherosclerosis of aorta: Secondary | ICD-10-CM | POA: Diagnosis not present

## 2020-09-27 DIAGNOSIS — R413 Other amnesia: Secondary | ICD-10-CM | POA: Diagnosis not present

## 2020-10-11 DIAGNOSIS — D485 Neoplasm of uncertain behavior of skin: Secondary | ICD-10-CM | POA: Diagnosis not present

## 2020-10-11 DIAGNOSIS — L905 Scar conditions and fibrosis of skin: Secondary | ICD-10-CM | POA: Diagnosis not present

## 2020-11-29 ENCOUNTER — Telehealth: Payer: Self-pay | Admitting: Neurology

## 2020-11-29 ENCOUNTER — Other Ambulatory Visit: Payer: Self-pay | Admitting: Neurology

## 2020-11-29 MED ORDER — DONEPEZIL HCL 5 MG PO TABS: 5.0000 mg | ORAL_TABLET | Freq: Every day | ORAL | 0 refills | Status: DC

## 2020-12-27 ENCOUNTER — Ambulatory Visit (INDEPENDENT_AMBULATORY_CARE_PROVIDER_SITE_OTHER): Payer: Medicare Other | Admitting: Neurology

## 2020-12-27 ENCOUNTER — Other Ambulatory Visit: Payer: Self-pay

## 2020-12-27 ENCOUNTER — Encounter: Payer: Self-pay | Admitting: Neurology

## 2020-12-27 VITALS — BP 131/69 | HR 50 | Ht 70.0 in | Wt 179.0 lb

## 2020-12-27 DIAGNOSIS — G3184 Mild cognitive impairment, so stated: Secondary | ICD-10-CM | POA: Diagnosis not present

## 2020-12-27 DIAGNOSIS — G43009 Migraine without aura, not intractable, without status migrainosus: Secondary | ICD-10-CM | POA: Diagnosis not present

## 2020-12-27 DIAGNOSIS — R519 Headache, unspecified: Secondary | ICD-10-CM

## 2020-12-27 MED ORDER — DONEPEZIL HCL 5 MG PO TABS: 5.0000 mg | ORAL_TABLET | Freq: Every day | ORAL | 3 refills | Status: DC

## 2020-12-27 MED ORDER — NORTRIPTYLINE HCL 10 MG PO CAPS: 10.0000 mg | ORAL_CAPSULE | Freq: Every day | ORAL | 3 refills | Status: DC

## 2020-12-27 NOTE — Patient Instructions (Addendum)
Start nortriptyline 10mg  at bedtime for 2 week, then increase to 2 tablet at bedtime  Return to clinic in 1 year

## 2020-12-27 NOTE — Progress Notes (Signed)
Follow-up Visit   Date: 12/27/20   Francisco Rodriguez MRN: [id number] DOB: 1950-11-12   Interim History: Francisco Rodriguez is a 70 y.o. right-handed Caucasian male with glaucoma, hypertension, hyperlipidemia, and depression presenting for evaluation of migraines and mild cognitive impairment.  He was active in international [employer] work and retired in 2015 due to neck pain and headaches.    History of present illness: He has long history of migraines since childhood and was taking OTC medications almost daily for many years.  After establishing care with [name], he advised to taper off OTC and limit to twice per week, which has also significantly improved the severity of his pain.  Currently, he takes takes conenzyme 200mg  daily for headaches.  He continues to have low-grade daily headaches and gets adequate relief with coenzyme 200mg /d which has reduced the intensity of his pain.  He takes imitrex 100mg  about twice per week for severe pain which helps.  Additionally, rest and sleep also helps. He has many food triggers and is vigilant about his diet.    He was diagnosed with mild cognitive impairment by [name], based on neurocognitive testing in 2017.  He was started on Aricept 5mg  daily and has been doing well.  Overall, wife and patient feel his memory is stable.  He sometimes forgets tasks and appointments, especially if he does not write it down.  He manages his own medications and drives without any safety issues.  He stays very active and walks for 1 hour daily.  He enjoys reading.  Mood and sleep is good.  UPDATE 12/27/2020:  He is here for follow-up  .  His migraines have been getting worse, occurring more than 15 days of the month.  He continues to take maxalt, which helps sometimes.  Lately, he has not been getting as much relief.  There has been no significant change in his memory.  He remains highly independent with ADLs and IADLs.   Medications:  Current Outpatient  Medications on File Prior to Visit  Medication Sig Dispense Refill  . aspirin EC 81 MG tablet Take 81 mg by mouth daily.    Marland Kitchen b complex vitamins tablet Take 1 tablet by mouth daily.    . Calcium Carbonate Antacid (TUMS ULTRA 1000 PO) Take 1 tablet by mouth daily.    . Cholecalciferol (VITAMIN D3) 1000 units CAPS Take 2,000 Units by mouth.    . donepezil (ARICEPT) 5 MG tablet Take 1 tablet (5 mg total) by mouth at bedtime. 90 tablet 0  . Omega-3 Fatty Acids (FISH OIL) 1000 MG CPDR Take 1,000 mg by mouth daily.    . rizatriptan (MAXALT) 10 MG tablet Take 1 tablet (10 mg total) by mouth as needed for migraine. May repeat in 2 hours if needed 30 tablet 2  . rosuvastatin (CRESTOR) 20 MG tablet     . tamsulosin (FLOMAX) 0.4 MG CAPS capsule Take 0.4 mg by mouth daily.    . timolol (BETIMOL) 0.5 % ophthalmic solution Place 1 drop into both eyes 2 (two) times daily.     . timolol (TIMOPTIC) 0.5 % ophthalmic solution 1 drop 2 times daily.    . rizatriptan (MAXALT) 10 MG tablet  (Patient not taking: Reported on 12/27/2020)    . sulfamethoxazole-trimethoprim (BACTRIM DS) 800-160 MG tablet Take 1 tablet by mouth 2 (two) times daily. (Patient not taking: Reported on 12/27/2020)    . SUMAtriptan (IMITREX) 100 MG tablet Take 1 tablet (100 mg total)  by mouth every 2 (two) hours as needed for migraine. Limit to twice per week. (Patient not taking: Reported on 12/27/2020) 30 tablet 3   No current facility-administered medications on file prior to visit.    Allergies:  Allergies  Allergen Reactions  . Gluten Meal Swelling    migraines  . Milk-Related Compounds   . Peanut Butter Flavor      Vital Signs:  BP 131/69   Pulse (!) 50   Ht 5\' 10"  (1.778 m)   Wt 179 lb (81.2 kg)   SpO2 99%   BMI 25.68 kg/m    Neurological Exam: MENTAL STATUS including orientation to time, place, person, recent and remote memory, attention span and concentration, language, and fund of knowledge is normal.  Speech is not  dysarthric.  Montreal Cognitive Assessment  12/25/2018 12/24/2017  Visuospatial/ Executive (0/5) 2 5  Naming (0/3) 3 3  Attention: Read list of digits (0/2) 2 2  Attention: Read list of letters (0/1) 1 1  Attention: Serial 7 subtraction starting at 100 (0/3) 1 2  Language: Repeat phrase (0/2) 1 2  Language : Fluency (0/1) 0 0  Abstraction (0/2) 2 1  Delayed Recall (0/5) 3 2  Orientation (0/6) 5 6  Total 20 24  Adjusted Score (based on education) 20 24   CRANIAL NERVES:   Pupils equal round and reactive to light.  Normal conjugate, extra-ocular eye movements in all directions of gaze.  No ptosis.  Face is symmetric. Palate elevates symmetrically.  Tongue is midline.  MOTOR:  Motor strength is 5/5 in all extremities.  No atrophy, fasciculations or abnormal movements.  No pronator drift.  Tone is normal.    MSRs:  Reflexes are 2+/4 throughout .  SENSORY:  Intact to vibration throughout.  COORDINATION/GAIT:  Normal finger-to- nose-finger.  Intact rapid alternating movements bilaterally.  Gait narrow based and stable.   Data: MRI brain wwo contrast 03/29/2015:  Normal pre- and postcontrast MRI of the brain.  NCS/EMG of the legs 07/21/2015:  There is electrodiagnostic evidence for a mild, chronic, axonal sensory motor polyneuropathy.  Formal neuropsychological testing January 2017:  Mild cognitive impairment   IMPRESSION/PLAN: 1.  Chronic migraine with headaches occuring > 15 days of the month  - Previously tried amitriptyline, valproate, magnesium  - Start preventative nortriptyline 10mg  at bedtime for 2 week, then increase to 2 tablet at bedtime  - Continue maxalt 10mg  as needed for severe migraine  2.  Mild cognitive impairment (diagnosed 2017), clinically stable.  No progression into dementia.   - Continue low dose aricept 5mg  daily  - He remains highly active, which was encouraged  Return to clinic in 1 year.    Thank you for allowing me to participate in patient's care.  If  I can answer any additional questions, I would be pleased to do so.    Sincerely,    Jelisha Weed K. Posey Pronto, DO

## 2020-12-28 DIAGNOSIS — E559 Vitamin D deficiency, unspecified: Secondary | ICD-10-CM | POA: Diagnosis not present

## 2020-12-28 DIAGNOSIS — G3184 Mild cognitive impairment, so stated: Secondary | ICD-10-CM | POA: Diagnosis not present

## 2020-12-28 DIAGNOSIS — Z8669 Personal history of other diseases of the nervous system and sense organs: Secondary | ICD-10-CM | POA: Diagnosis not present

## 2020-12-28 DIAGNOSIS — I700 Atherosclerosis of aorta: Secondary | ICD-10-CM | POA: Diagnosis not present

## 2020-12-28 DIAGNOSIS — E785 Hyperlipidemia, unspecified: Secondary | ICD-10-CM | POA: Diagnosis not present

## 2020-12-28 DIAGNOSIS — M8580 Other specified disorders of bone density and structure, unspecified site: Secondary | ICD-10-CM | POA: Diagnosis not present

## 2021-02-08 DIAGNOSIS — Z23 Encounter for immunization: Secondary | ICD-10-CM | POA: Diagnosis not present

## 2021-02-16 MED ORDER — DONEPEZIL HCL 5 MG PO TABS: 5.0000 mg | ORAL_TABLET | Freq: Every day | ORAL | 3 refills | Status: DC

## 2021-02-21 MED ORDER — DONEPEZIL HCL 5 MG PO TBDP: 5.0000 mg | ORAL_TABLET | Freq: Every day | ORAL | 3 refills | Status: DC

## 2021-03-01 DIAGNOSIS — L905 Scar conditions and fibrosis of skin: Secondary | ICD-10-CM | POA: Diagnosis not present

## 2021-03-01 DIAGNOSIS — Z85828 Personal history of other malignant neoplasm of skin: Secondary | ICD-10-CM | POA: Diagnosis not present

## 2021-03-01 DIAGNOSIS — D485 Neoplasm of uncertain behavior of skin: Secondary | ICD-10-CM | POA: Diagnosis not present

## 2021-03-01 DIAGNOSIS — D034 Melanoma in situ of scalp and neck: Secondary | ICD-10-CM | POA: Diagnosis not present

## 2021-03-01 DIAGNOSIS — D224 Melanocytic nevi of scalp and neck: Secondary | ICD-10-CM | POA: Diagnosis not present

## 2021-03-01 DIAGNOSIS — D1801 Hemangioma of skin and subcutaneous tissue: Secondary | ICD-10-CM | POA: Diagnosis not present

## 2021-03-01 DIAGNOSIS — L570 Actinic keratosis: Secondary | ICD-10-CM | POA: Diagnosis not present

## 2021-03-01 DIAGNOSIS — D225 Melanocytic nevi of trunk: Secondary | ICD-10-CM | POA: Diagnosis not present

## 2021-03-01 DIAGNOSIS — L821 Other seborrheic keratosis: Secondary | ICD-10-CM | POA: Diagnosis not present

## 2021-03-30 DIAGNOSIS — N401 Enlarged prostate with lower urinary tract symptoms: Secondary | ICD-10-CM | POA: Diagnosis not present

## 2021-04-13 DIAGNOSIS — R351 Nocturia: Secondary | ICD-10-CM | POA: Diagnosis not present

## 2021-04-13 DIAGNOSIS — R972 Elevated prostate specific antigen [PSA]: Secondary | ICD-10-CM | POA: Diagnosis not present

## 2021-04-13 DIAGNOSIS — R3914 Feeling of incomplete bladder emptying: Secondary | ICD-10-CM | POA: Diagnosis not present

## 2021-04-13 DIAGNOSIS — N401 Enlarged prostate with lower urinary tract symptoms: Secondary | ICD-10-CM | POA: Diagnosis not present

## 2021-05-06 DIAGNOSIS — D034 Melanoma in situ of scalp and neck: Secondary | ICD-10-CM | POA: Diagnosis not present

## 2021-05-06 DIAGNOSIS — L989 Disorder of the skin and subcutaneous tissue, unspecified: Secondary | ICD-10-CM | POA: Diagnosis not present

## 2021-05-06 DIAGNOSIS — D485 Neoplasm of uncertain behavior of skin: Secondary | ICD-10-CM | POA: Diagnosis not present

## 2021-05-06 DIAGNOSIS — L905 Scar conditions and fibrosis of skin: Secondary | ICD-10-CM | POA: Diagnosis not present

## 2021-05-22 DIAGNOSIS — R509 Fever, unspecified: Secondary | ICD-10-CM | POA: Diagnosis not present

## 2021-05-22 DIAGNOSIS — R5383 Other fatigue: Secondary | ICD-10-CM | POA: Diagnosis not present

## 2021-05-22 DIAGNOSIS — U071 COVID-19: Secondary | ICD-10-CM | POA: Diagnosis not present

## 2021-05-22 DIAGNOSIS — J029 Acute pharyngitis, unspecified: Secondary | ICD-10-CM | POA: Diagnosis not present

## 2021-07-01 DIAGNOSIS — G3184 Mild cognitive impairment, so stated: Secondary | ICD-10-CM | POA: Diagnosis not present

## 2021-07-01 DIAGNOSIS — I700 Atherosclerosis of aorta: Secondary | ICD-10-CM | POA: Diagnosis not present

## 2021-07-01 DIAGNOSIS — E785 Hyperlipidemia, unspecified: Secondary | ICD-10-CM | POA: Diagnosis not present

## 2021-07-01 DIAGNOSIS — M8580 Other specified disorders of bone density and structure, unspecified site: Secondary | ICD-10-CM | POA: Diagnosis not present

## 2021-07-01 DIAGNOSIS — E559 Vitamin D deficiency, unspecified: Secondary | ICD-10-CM | POA: Diagnosis not present

## 2021-07-01 DIAGNOSIS — Z8669 Personal history of other diseases of the nervous system and sense organs: Secondary | ICD-10-CM | POA: Diagnosis not present

## 2021-07-21 DIAGNOSIS — H35363 Drusen (degenerative) of macula, bilateral: Secondary | ICD-10-CM | POA: Diagnosis not present

## 2021-07-21 DIAGNOSIS — H401131 Primary open-angle glaucoma, bilateral, mild stage: Secondary | ICD-10-CM | POA: Diagnosis not present

## 2021-07-21 DIAGNOSIS — Z961 Presence of intraocular lens: Secondary | ICD-10-CM | POA: Diagnosis not present

## 2021-07-21 DIAGNOSIS — H35033 Hypertensive retinopathy, bilateral: Secondary | ICD-10-CM | POA: Diagnosis not present

## 2021-08-08 DIAGNOSIS — Z23 Encounter for immunization: Secondary | ICD-10-CM | POA: Diagnosis not present

## 2021-08-15 DIAGNOSIS — Z23 Encounter for immunization: Secondary | ICD-10-CM | POA: Diagnosis not present

## 2021-09-01 DIAGNOSIS — D225 Melanocytic nevi of trunk: Secondary | ICD-10-CM | POA: Diagnosis not present

## 2021-09-01 DIAGNOSIS — Z85828 Personal history of other malignant neoplasm of skin: Secondary | ICD-10-CM | POA: Diagnosis not present

## 2021-09-01 DIAGNOSIS — Z872 Personal history of diseases of the skin and subcutaneous tissue: Secondary | ICD-10-CM | POA: Diagnosis not present

## 2021-09-01 DIAGNOSIS — Z85820 Personal history of malignant melanoma of skin: Secondary | ICD-10-CM | POA: Diagnosis not present

## 2021-09-01 DIAGNOSIS — Z08 Encounter for follow-up examination after completed treatment for malignant neoplasm: Secondary | ICD-10-CM | POA: Diagnosis not present

## 2021-09-01 DIAGNOSIS — D485 Neoplasm of uncertain behavior of skin: Secondary | ICD-10-CM | POA: Diagnosis not present

## 2021-09-01 DIAGNOSIS — R229 Localized swelling, mass and lump, unspecified: Secondary | ICD-10-CM | POA: Diagnosis not present

## 2021-09-01 DIAGNOSIS — C434 Malignant melanoma of scalp and neck: Secondary | ICD-10-CM | POA: Diagnosis not present

## 2021-09-29 DIAGNOSIS — Z1389 Encounter for screening for other disorder: Secondary | ICD-10-CM | POA: Diagnosis not present

## 2021-09-29 DIAGNOSIS — Z0000 Encounter for general adult medical examination without abnormal findings: Secondary | ICD-10-CM | POA: Diagnosis not present

## 2021-11-01 DIAGNOSIS — L905 Scar conditions and fibrosis of skin: Secondary | ICD-10-CM | POA: Diagnosis not present

## 2021-11-01 DIAGNOSIS — D485 Neoplasm of uncertain behavior of skin: Secondary | ICD-10-CM | POA: Diagnosis not present

## 2021-12-27 DIAGNOSIS — G3184 Mild cognitive impairment, so stated: Secondary | ICD-10-CM | POA: Diagnosis not present

## 2021-12-27 DIAGNOSIS — M8580 Other specified disorders of bone density and structure, unspecified site: Secondary | ICD-10-CM | POA: Diagnosis not present

## 2021-12-27 DIAGNOSIS — E785 Hyperlipidemia, unspecified: Secondary | ICD-10-CM | POA: Diagnosis not present

## 2021-12-27 DIAGNOSIS — I700 Atherosclerosis of aorta: Secondary | ICD-10-CM | POA: Diagnosis not present

## 2021-12-27 DIAGNOSIS — E559 Vitamin D deficiency, unspecified: Secondary | ICD-10-CM | POA: Diagnosis not present

## 2021-12-27 DIAGNOSIS — Z8669 Personal history of other diseases of the nervous system and sense organs: Secondary | ICD-10-CM | POA: Diagnosis not present

## 2021-12-29 NOTE — Progress Notes (Signed)
? ? ?Follow-up Visit ? ? ?Date: 12/30/21 ? ? ?Francisco Rodriguez ?MRN: [id number] ?DOB: 07-Aug-1951 ? ? ?Interim History: ?Francisco Rodriguez is a 71 y.o. right-handed Caucasian male with glaucoma, hypertension, hyperlipidemia, and depression presenting for evaluation of migraines and mild cognitive impairment.  He was active in international [employer] work and retired in 2015 due to neck pain and headaches.   ? ?History of present illness: ?He has long history of migraines since childhood and was taking OTC medications almost daily for many years.  After establishing care with [name], he advised to taper off OTC and limit to twice per week, which has also significantly improved the severity of his pain.  Currently, he takes takes conenzyme '200mg'$  daily for headaches.  He continues to have low-grade daily headaches and gets adequate relief with coenzyme '200mg'$ /d which has reduced the intensity of his pain.  He takes imitrex '100mg'$  about twice per week for severe pain which helps.  Additionally, rest and sleep also helps. He has many food triggers and is vigilant about his diet.   ?  ?He was diagnosed with mild cognitive impairment by [name], based on neurocognitive testing in 2017.  He was started on Aricept '5mg'$  daily and has been doing well.  Overall, wife and patient feel his memory is stable.  He sometimes forgets tasks and appointments, especially if he does not write it down.  He manages his own medications and drives without any safety issues.  He stays very active and walks for 1 hour daily.  He enjoys reading.  Mood and sleep is good. ? ?UPDATE 12/27/2020:  He is here for follow-up  .  His migraines have been getting worse, occurring more than 15 days of the month.  He continues to take maxalt, which helps sometimes.  Lately, he has not been getting as much relief.  There has been no significant change in his memory.   ? ?UPDATE 12/30/2021:  At his last visit, I started nortriptyline '20mg'$  at bedtime; however he  stopped taking this because of corn additive which he is allergic to.  His migraines are improved and occur about once per week, which is responsive to ibuprofen and tylenol. He denies any significant change in cognitive functioning.  He remains independent with all ADLs and IADLs. He continues to walk 2-3 miles daily.  ? ?Medications:  ?Current Outpatient Medications on File Prior to Visit  ?Medication Sig Dispense Refill  ? acetaminophen (TYLENOL) 325 MG tablet Take 650 mg by mouth every 6 (six) hours as needed.    ? aspirin EC 81 MG tablet Take 81 mg by mouth daily.    ? b complex vitamins tablet Take 1 tablet by mouth daily.    ? CALCIUM CARB-CHOLECALCIFEROL PO Take by mouth. Calcium Carb Powder    ? Cholecalciferol (VITAMIN D3) 1000 units CAPS Take 2,000 Units by mouth.    ? donepezil (ARICEPT ODT) 5 MG disintegrating tablet Take 1 tablet (5 mg total) by mouth at bedtime. 90 tablet 3  ? ibuprofen (ADVIL) 200 MG tablet Take 200 mg by mouth every 6 (six) hours as needed.    ? Omega-3 Fatty Acids (FISH OIL) 1000 MG CPDR Take 1,000 mg by mouth daily.    ? rosuvastatin (CRESTOR) 20 MG tablet '20mg'$  three days a week    ? timolol (BETIMOL) 0.5 % ophthalmic solution Place 1 drop into both eyes 2 (two) times daily.     ? ?No current facility-administered medications on file  prior to visit.  ? ? ?Allergies:  ?Allergies  ?Allergen Reactions  ? Corn-Containing Products   ? Gluten Meal Swelling  ?  migraines  ? Milk-Related Compounds   ? Peanut Butter Flavor   ? ? ? ?Vital Signs:  ?BP 124/89   Pulse (!) 49   Ht '5\' 10"'$  (1.778 m)   Wt 179 lb (81.2 kg)   SpO2 97%   BMI 25.68 kg/m?  ? ? ?Neurological Exam: ?MENTAL STATUS including orientation to time, place, person, recent and remote memory, attention span and concentration, language, and fund of knowledge is normal.  Speech is not dysarthric. ? ?Montreal Cognitive Assessment  12/25/2018 12/24/2017  ?Visuospatial/ Executive (0/5) 2 5  ?Naming (0/3) 3 3  ?Attention: Read list of  digits (0/2) 2 2  ?Attention: Read list of letters (0/1) 1 1  ?Attention: Serial 7 subtraction starting at 100 (0/3) 1 2  ?Language: Repeat phrase (0/2) 1 2  ?Language : Fluency (0/1) 0 0  ?Abstraction (0/2) 2 1  ?Delayed Recall (0/5) 3 2  ?Orientation (0/6) 5 6  ?Total 20 24  ?Adjusted Score (based on education) 20 24  ? ?CRANIAL NERVES:     Normal conjugate, extra-ocular eye movements in all directions of gaze.  No ptosis.  ? ?MOTOR:  Motor strength is 5/5 in all extremities.   ? ?COORDINATION/GAIT:  Normal finger-to- nose-finger.  Gait narrow based and stable.  ? ?Data: ?MRI brain wwo contrast 03/29/2015:  Normal pre- and postcontrast MRI of the brain. ?  ?NCS/EMG of the legs 07/21/2015:  There is electrodiagnostic evidence for a mild, chronic, axonal sensory motor polyneuropathy. ?  ?Formal neuropsychological testing January 2017:  Mild cognitive impairment ?  ? ?IMPRESSION/PLAN: ?1.  Episodic migraine.  Previously with chronic daily headaches which has improved to now occurring once per week. ? - Previously tried amitriptyline, valproate, magnesium, maxalt ? - D/C nortriptyline ? - OK to treat with NSAIDs/tylenol.  Limit to twice per week ? ?2.  Mild cognitive impairment (diagnosed 2017), clinically stable.  No progression into dementia.  ? - Continue aricept '5mg'$  daily oral disintegrating ? - He remains highly active, which was encouraged ? ?Return to clinic in 1 year ? ? ?Thank you for allowing me to participate in patient's care.  If I can answer any additional questions, I would be pleased to do so.   ? ?Sincerely, ? ? ? ?Rickesha Veracruz K. Posey Pronto, DO ? ? ?

## 2021-12-30 ENCOUNTER — Ambulatory Visit (INDEPENDENT_AMBULATORY_CARE_PROVIDER_SITE_OTHER): Payer: Medicare Other | Admitting: Neurology

## 2021-12-30 ENCOUNTER — Other Ambulatory Visit: Payer: Self-pay

## 2021-12-30 ENCOUNTER — Encounter: Payer: Self-pay | Admitting: Neurology

## 2021-12-30 VITALS — BP 124/89 | HR 49 | Ht 70.0 in | Wt 179.0 lb

## 2021-12-30 DIAGNOSIS — G43809 Other migraine, not intractable, without status migrainosus: Secondary | ICD-10-CM | POA: Diagnosis not present

## 2021-12-30 DIAGNOSIS — G3184 Mild cognitive impairment, so stated: Secondary | ICD-10-CM | POA: Diagnosis not present

## 2021-12-30 MED ORDER — DONEPEZIL HCL 5 MG PO TBDP: 5.0000 mg | ORAL_TABLET | Freq: Every day | ORAL | 3 refills | Status: DC

## 2021-12-30 NOTE — Patient Instructions (Signed)
Return to clinic in 1 year.

## 2022-02-25 DIAGNOSIS — U071 COVID-19: Secondary | ICD-10-CM | POA: Diagnosis not present

## 2022-04-03 DIAGNOSIS — N401 Enlarged prostate with lower urinary tract symptoms: Secondary | ICD-10-CM | POA: Diagnosis not present

## 2022-04-07 DIAGNOSIS — Z87440 Personal history of urinary (tract) infections: Secondary | ICD-10-CM | POA: Diagnosis not present

## 2022-04-07 DIAGNOSIS — N390 Urinary tract infection, site not specified: Secondary | ICD-10-CM | POA: Diagnosis not present

## 2022-04-07 DIAGNOSIS — R8271 Bacteriuria: Secondary | ICD-10-CM | POA: Diagnosis not present

## 2022-04-07 DIAGNOSIS — R8279 Other abnormal findings on microbiological examination of urine: Secondary | ICD-10-CM | POA: Diagnosis not present

## 2022-04-07 DIAGNOSIS — R3914 Feeling of incomplete bladder emptying: Secondary | ICD-10-CM | POA: Diagnosis not present

## 2022-04-07 DIAGNOSIS — N401 Enlarged prostate with lower urinary tract symptoms: Secondary | ICD-10-CM | POA: Diagnosis not present

## 2022-07-17 DIAGNOSIS — Z23 Encounter for immunization: Secondary | ICD-10-CM | POA: Diagnosis not present

## 2022-07-21 DIAGNOSIS — Z6825 Body mass index (BMI) 25.0-25.9, adult: Secondary | ICD-10-CM | POA: Diagnosis not present

## 2022-07-21 DIAGNOSIS — E785 Hyperlipidemia, unspecified: Secondary | ICD-10-CM | POA: Diagnosis not present

## 2022-07-24 DIAGNOSIS — Z961 Presence of intraocular lens: Secondary | ICD-10-CM | POA: Diagnosis not present

## 2022-07-24 DIAGNOSIS — H35033 Hypertensive retinopathy, bilateral: Secondary | ICD-10-CM | POA: Diagnosis not present

## 2022-07-24 DIAGNOSIS — H401131 Primary open-angle glaucoma, bilateral, mild stage: Secondary | ICD-10-CM | POA: Diagnosis not present

## 2022-07-24 DIAGNOSIS — H21552 Recession of chamber angle, left eye: Secondary | ICD-10-CM | POA: Diagnosis not present

## 2022-07-24 DIAGNOSIS — H353131 Nonexudative age-related macular degeneration, bilateral, early dry stage: Secondary | ICD-10-CM | POA: Diagnosis not present

## 2022-07-26 DIAGNOSIS — Z23 Encounter for immunization: Secondary | ICD-10-CM | POA: Diagnosis not present

## 2022-11-01 DIAGNOSIS — L821 Other seborrheic keratosis: Secondary | ICD-10-CM | POA: Diagnosis not present

## 2022-11-01 DIAGNOSIS — L814 Other melanin hyperpigmentation: Secondary | ICD-10-CM | POA: Diagnosis not present

## 2022-11-01 DIAGNOSIS — D229 Melanocytic nevi, unspecified: Secondary | ICD-10-CM | POA: Diagnosis not present

## 2022-11-01 DIAGNOSIS — D2261 Melanocytic nevi of right upper limb, including shoulder: Secondary | ICD-10-CM | POA: Diagnosis not present

## 2022-11-01 DIAGNOSIS — D485 Neoplasm of uncertain behavior of skin: Secondary | ICD-10-CM | POA: Diagnosis not present

## 2022-11-01 DIAGNOSIS — Z85820 Personal history of malignant melanoma of skin: Secondary | ICD-10-CM | POA: Diagnosis not present

## 2022-11-01 DIAGNOSIS — C434 Malignant melanoma of scalp and neck: Secondary | ICD-10-CM | POA: Diagnosis not present

## 2022-11-01 DIAGNOSIS — L570 Actinic keratosis: Secondary | ICD-10-CM | POA: Diagnosis not present

## 2022-11-01 DIAGNOSIS — R229 Localized swelling, mass and lump, unspecified: Secondary | ICD-10-CM | POA: Diagnosis not present

## 2022-11-01 DIAGNOSIS — Z08 Encounter for follow-up examination after completed treatment for malignant neoplasm: Secondary | ICD-10-CM | POA: Diagnosis not present

## 2022-11-01 DIAGNOSIS — Z85828 Personal history of other malignant neoplasm of skin: Secondary | ICD-10-CM | POA: Diagnosis not present

## 2022-11-28 DIAGNOSIS — I700 Atherosclerosis of aorta: Secondary | ICD-10-CM | POA: Diagnosis not present

## 2022-11-28 DIAGNOSIS — Z1211 Encounter for screening for malignant neoplasm of colon: Secondary | ICD-10-CM | POA: Diagnosis not present

## 2022-11-28 DIAGNOSIS — G3184 Mild cognitive impairment, so stated: Secondary | ICD-10-CM | POA: Diagnosis not present

## 2022-11-28 DIAGNOSIS — E785 Hyperlipidemia, unspecified: Secondary | ICD-10-CM | POA: Diagnosis not present

## 2022-11-28 DIAGNOSIS — E559 Vitamin D deficiency, unspecified: Secondary | ICD-10-CM | POA: Diagnosis not present

## 2022-11-28 DIAGNOSIS — Z0000 Encounter for general adult medical examination without abnormal findings: Secondary | ICD-10-CM | POA: Diagnosis not present

## 2022-12-04 DIAGNOSIS — Z0000 Encounter for general adult medical examination without abnormal findings: Secondary | ICD-10-CM | POA: Diagnosis not present

## 2022-12-04 DIAGNOSIS — G3184 Mild cognitive impairment, so stated: Secondary | ICD-10-CM | POA: Diagnosis not present

## 2022-12-04 DIAGNOSIS — E785 Hyperlipidemia, unspecified: Secondary | ICD-10-CM | POA: Diagnosis not present

## 2022-12-04 DIAGNOSIS — E559 Vitamin D deficiency, unspecified: Secondary | ICD-10-CM | POA: Diagnosis not present

## 2023-01-01 ENCOUNTER — Encounter: Payer: Self-pay | Admitting: Neurology

## 2023-01-01 ENCOUNTER — Ambulatory Visit (INDEPENDENT_AMBULATORY_CARE_PROVIDER_SITE_OTHER): Payer: Medicare Other | Admitting: Neurology

## 2023-01-01 VITALS — BP 156/85 | HR 51 | Ht 70.0 in | Wt 179.0 lb

## 2023-01-01 DIAGNOSIS — G3184 Mild cognitive impairment, so stated: Secondary | ICD-10-CM

## 2023-01-01 DIAGNOSIS — G43809 Other migraine, not intractable, without status migrainosus: Secondary | ICD-10-CM

## 2023-01-01 MED ORDER — DONEPEZIL HCL 5 MG PO TBDP: 5.0000 mg | ORAL_TABLET | Freq: Every day | ORAL | 3 refills | Status: AC

## 2023-01-01 NOTE — Progress Notes (Signed)
Follow-up Visit   Date: 01/01/23   Francisco Rodriguez MRN: [id number] DOB: May 15, 1951   Interim History: Francisco Rodriguez is a 72 y.o. right-handed Caucasian male with glaucoma, hypertension, hyperlipidemia, and depression presenting for evaluation of migraines and mild cognitive impairment.  He was active in international [place] [employer] work and retired in 2015 due to neck pain and headaches.    IMPRESSION/PLAN: Episodic migraine, stable occurring about 1-2 times per week and responsive to ibuprofen/tylenol.  Previously tried amitriptyline, valproate, magnesium, maxalt, nortriptyline (effective, tapered off due to improvement)  2.  Mild cognitive impairment (diagnosed 2017), clinically stable.  No progression into dementia.   - Continue Aricept '5mg'$  daily oral disintegrating (corn allergy) - refilled.  OK to request future refill from PCP as he has been stable.  - Continue to stay active  Return to clinic as needed  --------------------------------------------------  History of present illness: He has long history of migraines since childhood and was taking OTC medications almost daily for many years.  After establishing care with [name], he advised to taper off OTC and limit to twice per week, which has also significantly improved the severity of his pain.  Currently, he takes takes conenzyme '200mg'$  daily for headaches.  He continues to have low-grade daily headaches and gets adequate relief with coenzyme '200mg'$ /d which has reduced the intensity of his pain.  He takes imitrex '100mg'$  about twice per week for severe pain which helps.  Additionally, rest and sleep also helps. He has many food triggers and is vigilant about his diet.     He was diagnosed with mild cognitive impairment by [name], based on neurocognitive testing in 2017.  He was started on Aricept '5mg'$  daily and has been doing well.  Overall, wife and patient feel his memory is stable.  He sometimes forgets tasks and  appointments, especially if he does not write it down.  He manages his own medications and drives without any safety issues.  He stays very active and walks for 1 hour daily.  He enjoys reading.  Mood and sleep is good.  UPDATE 12/27/2020:    His migraines have been getting worse, occurring more than 15 days of the month.  He continues to take maxalt, which helps sometimes.  Lately, he has not been getting as much relief.  There has been no significant change in his memory.    UPDATE 12/30/2021:  At his last visit, I started nortriptyline '20mg'$  at bedtime; however he stopped taking this because of corn additive which he is allergic to.  His migraines are improved and occur about once per week, which is responsive to ibuprofen and tylenol.   UPDATE 12/31/2021:  He is here for 1 year follow-up and reports to doing very well.  He continues to get mild-moderate headache about 1-2 times per week, which is responsive to OTC NSAIDs/tylenol.  There has been no change in independent functioning.  He remains independent with all ADLs and IADLs.  He is very active and walks 2-3 miles daily.   Medications:  Current Outpatient Medications on File Prior to Visit  Medication Sig Dispense Refill   acetaminophen (TYLENOL) 325 MG tablet Take 650 mg by mouth every 6 (six) hours as needed.     aspirin EC 81 MG tablet Take 81 mg by mouth daily.     b complex vitamins tablet Take 1 tablet by mouth daily.     CALCIUM CARB-CHOLECALCIFEROL PO Take by mouth. Calcium Carb Powder  Cholecalciferol (VITAMIN D3) 1000 units CAPS Take 2,000 Units by mouth.     donepezil (ARICEPT ODT) 5 MG disintegrating tablet Take 1 tablet (5 mg total) by mouth at bedtime. 90 tablet 3   ibuprofen (ADVIL) 200 MG tablet Take 200 mg by mouth every 6 (six) hours as needed.     Omega-3 Fatty Acids (FISH OIL) 1000 MG CPDR Take 1,000 mg by mouth daily.     rosuvastatin (CRESTOR) 20 MG tablet '20mg'$  three days a week     timolol (BETIMOL) 0.5 % ophthalmic  solution Place 1 drop into both eyes 2 (two) times daily.      No current facility-administered medications on file prior to visit.    Allergies:  Allergies  Allergen Reactions   Corn-Containing Products    Gluten Meal Swelling    migraines   Milk-Related Compounds    Peanut Butter Flavor      Vital Signs:  BP (!) 156/85   Pulse (!) 51   Ht '5\' 10"'$  (1.778 m)   Wt 179 lb (81.2 kg)   SpO2 98%   BMI 25.68 kg/m    Neurological Exam: MENTAL STATUS including orientation to time, place, person, recent and remote memory, attention span and concentration, language, and fund of knowledge is normal.  Speech is not dysarthric.  CRANIAL NERVES:     Normal conjugate, extra-ocular eye movements in all directions of gaze.  No ptosis. Face is symmetric.   MOTOR:  Motor strength is 5/5 in all extremities.    COORDINATION/GAIT:  Normal finger-to- nose-finger.  Gait narrow based and stable. Tandem gait intact.   Data: MRI brain wwo contrast 03/29/2015:  Normal pre- and postcontrast MRI of the brain.   NCS/EMG of the legs 07/21/2015:  There is electrodiagnostic evidence for a mild, chronic, axonal sensory motor polyneuropathy.   Formal neuropsychological testing January 2017:  Mild cognitive impairment      Thank you for allowing me to participate in patient's care.  If I can answer any additional questions, I would be pleased to do so.    Sincerely,    Rache Klimaszewski K. Posey Pronto, DO

## 2023-01-08 DIAGNOSIS — R058 Other specified cough: Secondary | ICD-10-CM | POA: Diagnosis not present

## 2023-01-08 DIAGNOSIS — J40 Bronchitis, not specified as acute or chronic: Secondary | ICD-10-CM | POA: Diagnosis not present

## 2023-01-29 DIAGNOSIS — Z8719 Personal history of other diseases of the digestive system: Secondary | ICD-10-CM | POA: Diagnosis not present

## 2023-01-29 DIAGNOSIS — R001 Bradycardia, unspecified: Secondary | ICD-10-CM | POA: Diagnosis not present

## 2023-01-29 DIAGNOSIS — R1110 Vomiting, unspecified: Secondary | ICD-10-CM | POA: Diagnosis not present

## 2023-01-29 DIAGNOSIS — Z1211 Encounter for screening for malignant neoplasm of colon: Secondary | ICD-10-CM | POA: Diagnosis not present

## 2023-02-05 DIAGNOSIS — H353131 Nonexudative age-related macular degeneration, bilateral, early dry stage: Secondary | ICD-10-CM | POA: Diagnosis not present

## 2023-02-05 DIAGNOSIS — H21552 Recession of chamber angle, left eye: Secondary | ICD-10-CM | POA: Diagnosis not present

## 2023-02-05 DIAGNOSIS — H401131 Primary open-angle glaucoma, bilateral, mild stage: Secondary | ICD-10-CM | POA: Diagnosis not present

## 2023-02-21 DIAGNOSIS — D120 Benign neoplasm of cecum: Secondary | ICD-10-CM | POA: Diagnosis not present

## 2023-02-21 DIAGNOSIS — K449 Diaphragmatic hernia without obstruction or gangrene: Secondary | ICD-10-CM | POA: Diagnosis not present

## 2023-02-21 DIAGNOSIS — K2100 Gastro-esophageal reflux disease with esophagitis, without bleeding: Secondary | ICD-10-CM | POA: Diagnosis not present

## 2023-02-21 DIAGNOSIS — K259 Gastric ulcer, unspecified as acute or chronic, without hemorrhage or perforation: Secondary | ICD-10-CM | POA: Diagnosis not present

## 2023-02-21 DIAGNOSIS — D125 Benign neoplasm of sigmoid colon: Secondary | ICD-10-CM | POA: Diagnosis not present

## 2023-02-21 DIAGNOSIS — K2930 Chronic superficial gastritis without bleeding: Secondary | ICD-10-CM | POA: Diagnosis not present

## 2023-02-21 DIAGNOSIS — K644 Residual hemorrhoidal skin tags: Secondary | ICD-10-CM | POA: Diagnosis not present

## 2023-02-21 DIAGNOSIS — R1110 Vomiting, unspecified: Secondary | ICD-10-CM | POA: Diagnosis not present

## 2023-02-21 DIAGNOSIS — K317 Polyp of stomach and duodenum: Secondary | ICD-10-CM | POA: Diagnosis not present

## 2023-02-21 DIAGNOSIS — K648 Other hemorrhoids: Secondary | ICD-10-CM | POA: Diagnosis not present

## 2023-02-21 DIAGNOSIS — Z1211 Encounter for screening for malignant neoplasm of colon: Secondary | ICD-10-CM | POA: Diagnosis not present

## 2023-02-28 DIAGNOSIS — D125 Benign neoplasm of sigmoid colon: Secondary | ICD-10-CM | POA: Diagnosis not present

## 2023-02-28 DIAGNOSIS — D120 Benign neoplasm of cecum: Secondary | ICD-10-CM | POA: Diagnosis not present

## 2023-02-28 DIAGNOSIS — K2930 Chronic superficial gastritis without bleeding: Secondary | ICD-10-CM | POA: Diagnosis not present

## 2023-05-14 DIAGNOSIS — L821 Other seborrheic keratosis: Secondary | ICD-10-CM | POA: Diagnosis not present

## 2023-05-14 DIAGNOSIS — Z85820 Personal history of malignant melanoma of skin: Secondary | ICD-10-CM | POA: Diagnosis not present

## 2023-05-14 DIAGNOSIS — Z85828 Personal history of other malignant neoplasm of skin: Secondary | ICD-10-CM | POA: Diagnosis not present

## 2023-05-14 DIAGNOSIS — Z08 Encounter for follow-up examination after completed treatment for malignant neoplasm: Secondary | ICD-10-CM | POA: Diagnosis not present

## 2023-05-14 DIAGNOSIS — D229 Melanocytic nevi, unspecified: Secondary | ICD-10-CM | POA: Diagnosis not present

## 2023-05-14 DIAGNOSIS — R229 Localized swelling, mass and lump, unspecified: Secondary | ICD-10-CM | POA: Diagnosis not present

## 2023-05-14 DIAGNOSIS — C4441 Basal cell carcinoma of skin of scalp and neck: Secondary | ICD-10-CM | POA: Diagnosis not present

## 2023-05-14 DIAGNOSIS — L814 Other melanin hyperpigmentation: Secondary | ICD-10-CM | POA: Diagnosis not present

## 2023-05-14 DIAGNOSIS — C4359 Malignant melanoma of other part of trunk: Secondary | ICD-10-CM | POA: Diagnosis not present

## 2023-05-14 DIAGNOSIS — D485 Neoplasm of uncertain behavior of skin: Secondary | ICD-10-CM | POA: Diagnosis not present

## 2023-06-14 DIAGNOSIS — C4441 Basal cell carcinoma of skin of scalp and neck: Secondary | ICD-10-CM | POA: Diagnosis not present

## 2023-07-30 DIAGNOSIS — Z23 Encounter for immunization: Secondary | ICD-10-CM | POA: Diagnosis not present

## 2023-08-16 DIAGNOSIS — Z23 Encounter for immunization: Secondary | ICD-10-CM | POA: Diagnosis not present

## 2023-09-17 DIAGNOSIS — H353131 Nonexudative age-related macular degeneration, bilateral, early dry stage: Secondary | ICD-10-CM | POA: Diagnosis not present

## 2023-09-17 DIAGNOSIS — H21552 Recession of chamber angle, left eye: Secondary | ICD-10-CM | POA: Diagnosis not present

## 2023-09-17 DIAGNOSIS — H401131 Primary open-angle glaucoma, bilateral, mild stage: Secondary | ICD-10-CM | POA: Diagnosis not present

## 2023-09-17 DIAGNOSIS — D23122 Other benign neoplasm of skin of left lower eyelid, including canthus: Secondary | ICD-10-CM | POA: Diagnosis not present

## 2023-11-14 DIAGNOSIS — Z08 Encounter for follow-up examination after completed treatment for malignant neoplasm: Secondary | ICD-10-CM | POA: Diagnosis not present

## 2023-11-14 DIAGNOSIS — L821 Other seborrheic keratosis: Secondary | ICD-10-CM | POA: Diagnosis not present

## 2023-11-14 DIAGNOSIS — C434 Malignant melanoma of scalp and neck: Secondary | ICD-10-CM | POA: Diagnosis not present

## 2023-11-14 DIAGNOSIS — Z85828 Personal history of other malignant neoplasm of skin: Secondary | ICD-10-CM | POA: Diagnosis not present

## 2023-11-14 DIAGNOSIS — L814 Other melanin hyperpigmentation: Secondary | ICD-10-CM | POA: Diagnosis not present

## 2023-11-14 DIAGNOSIS — R229 Localized swelling, mass and lump, unspecified: Secondary | ICD-10-CM | POA: Diagnosis not present

## 2023-11-14 DIAGNOSIS — Z85820 Personal history of malignant melanoma of skin: Secondary | ICD-10-CM | POA: Diagnosis not present

## 2024-01-11 DIAGNOSIS — E559 Vitamin D deficiency, unspecified: Secondary | ICD-10-CM | POA: Diagnosis not present

## 2024-01-11 DIAGNOSIS — G3184 Mild cognitive impairment, so stated: Secondary | ICD-10-CM | POA: Diagnosis not present

## 2024-01-11 DIAGNOSIS — E785 Hyperlipidemia, unspecified: Secondary | ICD-10-CM | POA: Diagnosis not present

## 2024-01-11 DIAGNOSIS — Z0000 Encounter for general adult medical examination without abnormal findings: Secondary | ICD-10-CM | POA: Diagnosis not present

## 2024-01-11 DIAGNOSIS — Z1211 Encounter for screening for malignant neoplasm of colon: Secondary | ICD-10-CM | POA: Diagnosis not present

## 2024-01-14 DIAGNOSIS — E785 Hyperlipidemia, unspecified: Secondary | ICD-10-CM | POA: Diagnosis not present

## 2024-01-14 DIAGNOSIS — G3184 Mild cognitive impairment, so stated: Secondary | ICD-10-CM | POA: Diagnosis not present

## 2024-01-14 DIAGNOSIS — Z0000 Encounter for general adult medical examination without abnormal findings: Secondary | ICD-10-CM | POA: Diagnosis not present

## 2024-01-14 DIAGNOSIS — E559 Vitamin D deficiency, unspecified: Secondary | ICD-10-CM | POA: Diagnosis not present

## 2024-05-08 DIAGNOSIS — R229 Localized swelling, mass and lump, unspecified: Secondary | ICD-10-CM | POA: Diagnosis not present

## 2024-05-08 DIAGNOSIS — Z872 Personal history of diseases of the skin and subcutaneous tissue: Secondary | ICD-10-CM | POA: Diagnosis not present

## 2024-05-08 DIAGNOSIS — C434 Malignant melanoma of scalp and neck: Secondary | ICD-10-CM | POA: Diagnosis not present

## 2024-05-08 DIAGNOSIS — L814 Other melanin hyperpigmentation: Secondary | ICD-10-CM | POA: Diagnosis not present

## 2024-05-08 DIAGNOSIS — Z85820 Personal history of malignant melanoma of skin: Secondary | ICD-10-CM | POA: Diagnosis not present

## 2024-05-08 DIAGNOSIS — D225 Melanocytic nevi of trunk: Secondary | ICD-10-CM | POA: Diagnosis not present

## 2024-05-08 DIAGNOSIS — Z08 Encounter for follow-up examination after completed treatment for malignant neoplasm: Secondary | ICD-10-CM | POA: Diagnosis not present

## 2024-05-08 DIAGNOSIS — Z09 Encounter for follow-up examination after completed treatment for conditions other than malignant neoplasm: Secondary | ICD-10-CM | POA: Diagnosis not present

## 2024-05-08 DIAGNOSIS — L821 Other seborrheic keratosis: Secondary | ICD-10-CM | POA: Diagnosis not present

## 2024-05-08 DIAGNOSIS — Z85828 Personal history of other malignant neoplasm of skin: Secondary | ICD-10-CM | POA: Diagnosis not present

## 2024-07-09 DIAGNOSIS — Z23 Encounter for immunization: Secondary | ICD-10-CM | POA: Diagnosis not present

## 2024-07-17 DIAGNOSIS — Z23 Encounter for immunization: Secondary | ICD-10-CM | POA: Diagnosis not present

## 2024-09-26 DIAGNOSIS — H21552 Recession of chamber angle, left eye: Secondary | ICD-10-CM | POA: Diagnosis not present

## 2024-09-26 DIAGNOSIS — H401131 Primary open-angle glaucoma, bilateral, mild stage: Secondary | ICD-10-CM | POA: Diagnosis not present

## 2024-09-26 DIAGNOSIS — H353131 Nonexudative age-related macular degeneration, bilateral, early dry stage: Secondary | ICD-10-CM | POA: Diagnosis not present
